# Patient Record
Sex: Female | Born: 1995 | Race: White | Hispanic: No | Marital: Married
Health system: Midwestern US, Community
[De-identification: ages and names within clinical notes are randomized; demographics above are authoritative.]

## PROBLEM LIST (undated history)

## (undated) DIAGNOSIS — R079 Chest pain, unspecified: Secondary | ICD-10-CM

## (undated) DIAGNOSIS — Z30011 Encounter for initial prescription of contraceptive pills: Secondary | ICD-10-CM

## (undated) DIAGNOSIS — R87610 Atypical squamous cells of undetermined significance on cytologic smear of cervix (ASC-US): Secondary | ICD-10-CM

## (undated) DIAGNOSIS — F32A Depression, unspecified: Secondary | ICD-10-CM

## (undated) DIAGNOSIS — R87810 Cervical high risk human papillomavirus (HPV) DNA test positive: Secondary | ICD-10-CM

## (undated) DIAGNOSIS — R0602 Shortness of breath: Secondary | ICD-10-CM

## (undated) DIAGNOSIS — M545 Low back pain, unspecified: Secondary | ICD-10-CM

## (undated) DIAGNOSIS — Z3041 Encounter for surveillance of contraceptive pills: Secondary | ICD-10-CM

## (undated) DIAGNOSIS — N870 Mild cervical dysplasia: Secondary | ICD-10-CM

## (undated) DIAGNOSIS — N879 Dysplasia of cervix uteri, unspecified: Secondary | ICD-10-CM

---
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
## 2017-08-08 ENCOUNTER — Ambulatory Visit: Admit: 2017-08-08 | Discharge: 2017-08-08 | Payer: BLUE CROSS/BLUE SHIELD | Attending: Family | Primary: Family Medicine

## 2017-08-08 DIAGNOSIS — N926 Irregular menstruation, unspecified: Secondary | ICD-10-CM

## 2017-08-08 LAB — AMB POC URINALYSIS DIP STICK AUTO W/O MICRO
Bilirubin (UA POC): NEGATIVE
Blood (UA POC): NEGATIVE
Glucose (UA POC): NEGATIVE
Ketones (UA POC): NEGATIVE
Leukocyte esterase (UA POC): NEGATIVE
Nitrites (UA POC): NEGATIVE
Protein (UA POC): NEGATIVE
Specific gravity (UA POC): 1.02 (ref 1.001–1.035)
Urobilinogen (UA POC): NORMAL (ref 0.2–1)
pH (UA POC): 6 (ref 4.6–8.0)

## 2017-08-08 LAB — AMB POC URINE PREGNANCY TEST, VISUAL COLOR COMPARISON: HCG urine, Ql. (POC): NEGATIVE

## 2017-08-08 MED ORDER — MELOXICAM 7.5 MG TAB
7.5 mg | ORAL_TABLET | Freq: Every day | ORAL | 0 refills | Status: DC
Start: 2017-08-08 — End: 2018-02-13

## 2017-08-08 MED ORDER — TRIMETHOPRIM-SULFAMETHOXAZOLE 160 MG-800 MG TAB
160-800 mg | ORAL_TABLET | Freq: Two times a day (BID) | ORAL | 0 refills | Status: AC
Start: 2017-08-08 — End: 2017-08-18

## 2017-08-08 MED ORDER — CYCLOBENZAPRINE 5 MG TAB
5 mg | ORAL_TABLET | Freq: Every evening | ORAL | 0 refills | Status: DC | PRN
Start: 2017-08-08 — End: 2018-02-13

## 2017-08-08 NOTE — Progress Notes (Signed)
Please mail normal letter.  aw

## 2017-08-08 NOTE — Progress Notes (Signed)
FMLA Forms faxed 08/08/17  pl
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
## 2017-08-08 NOTE — Progress Notes (Signed)
Lab letter mailed 08/09/17  pl

## 2017-08-09 LAB — CBC WITH AUTOMATED DIFF
ABS. BASOPHILS: 0 10*3/uL (ref 0.0–0.2)
ABS. EOSINOPHILS: 0.3 10*3/uL (ref 0.0–0.4)
ABS. IMM. GRANS.: 0 10*3/uL (ref 0.0–0.1)
ABS. MONOCYTES: 0.4 10*3/uL (ref 0.1–0.9)
ABS. NEUTROPHILS: 2.7 10*3/uL (ref 1.4–7.0)
Abs Lymphocytes: 2.3 10*3/uL (ref 0.7–3.1)
BASOPHILS: 0 %
EOSINOPHILS: 6 %
HCT: 38.5 % (ref 34.0–46.6)
HGB: 12.1 g/dL (ref 11.1–15.9)
IMMATURE GRANULOCYTES: 0 %
Lymphocytes: 40 %
MCH: 28.5 pg (ref 26.6–33.0)
MCHC: 31.4 g/dL — ABNORMAL LOW (ref 31.5–35.7)
MCV: 91 fL (ref 79–97)
MONOCYTES: 7 %
NEUTROPHILS: 47 %
PLATELET: 316 10*3/uL (ref 150–379)
RBC: 4.25 x10E6/uL (ref 3.77–5.28)
RDW: 13.4 % (ref 12.3–15.4)
WBC: 5.8 10*3/uL (ref 3.4–10.8)

## 2017-08-09 LAB — 11-DRUG SCREEN, URINE W RFLX CONFIRM
Amphetamines, urine: NEGATIVE ng/mL
Barbiturates: NEGATIVE ng/mL
Benzodiazepines: NEGATIVE ng/mL
Cannabinoids: NEGATIVE ng/mL
Cocaine: NEGATIVE ng/mL
MDMA: NEGATIVE ng/mL
Methadone Screen, Urine: NEGATIVE ng/mL
Methaqualone: NEGATIVE ng/mL
Opiates: NEGATIVE ng/mL
PROPOXYPHENE, URINE: NEGATIVE ng/mL
Phencyclidine: NEGATIVE ng/mL

## 2017-08-29 ENCOUNTER — Encounter

## 2017-09-11 NOTE — Telephone Encounter (Signed)
Last OV 08/08/17, calling today advising she has several abscesses.   Asking for RF Bactrim or does she need OV?

## 2017-10-15 ENCOUNTER — Encounter

## 2017-10-19 ENCOUNTER — Ambulatory Visit: Admit: 2017-10-19 | Discharge: 2017-10-19 | Payer: BLUE CROSS/BLUE SHIELD | Attending: Family | Primary: Family Medicine

## 2017-10-19 ENCOUNTER — Ambulatory Visit: Attending: Family | Primary: Family Medicine

## 2017-10-19 DIAGNOSIS — Z30011 Encounter for initial prescription of contraceptive pills: Secondary | ICD-10-CM

## 2017-10-19 MED ORDER — NORETHINDRONE-ETHINYL ESTRADIOL-IRON 1 MG-20 MCG TAB
1 mg-20 mcg (2)/75 mg (7) | PACK | Freq: Every day | ORAL | 3 refills | Status: DC
Start: 2017-10-19 — End: 2019-03-15

## 2017-10-19 NOTE — Progress Notes (Signed)
LASHAE Perry is a 22 y.o. female who presents with   Chief Complaint   Patient presents with   ??? Contraception      BC refill   ??? Warts      left fourth toe       History of Present Illness  She is feeling well.  No side effects with current  OCP.  No history of problems with hormones.  No personal or family history of blood clots.  Patient does not smoke but does use the vaporizer pen.  Encouraged cessation.  No history of migraines with aura.  She is sexually active.     Skin Problem   The history is provided by the patient. This is a new problem. The current episode started more than 1 week ago. The problem occurs constantly. The problem has not changed since onset.Pertinent negatives include no chest pain, no abdominal pain, no headaches and no shortness of breath. Nothing aggravates the symptoms. Nothing relieves the symptoms. She has tried nothing for the symptoms.       Review of Systems  Review of Systems   Constitutional: Negative for chills, fever and malaise/fatigue.   HENT: Negative for congestion, ear pain, sinus pain and sore throat.    Eyes: Negative for blurred vision, double vision, discharge and redness.   Respiratory: Negative for cough, sputum production, shortness of breath and wheezing.    Cardiovascular: Negative for chest pain, palpitations and leg swelling.   Gastrointestinal: Negative for abdominal pain, blood in stool, constipation, diarrhea, nausea and vomiting.   Genitourinary: Negative for dysuria, frequency, hematuria and urgency.   Musculoskeletal: Negative for joint pain and myalgias.   Neurological: Negative for dizziness, sensory change and headaches.   Endo/Heme/Allergies: Negative for environmental allergies.   Psychiatric/Behavioral: Negative for depression and suicidal ideas. The patient is not nervous/anxious.        Medications  Current Outpatient Medications   Medication Sig   ??? norethindrone-ethinyl estradiol (JUNEL FE 1/20) 1 mg-20 mcg (21)/75 mg (7) tab Take 1 Tab by  mouth daily.   ??? cyclobenzaprine (FLEXERIL) 5 mg tablet Take 1 Tab by mouth nightly as needed for Muscle Spasm(s).   ??? meloxicam (MOBIC) 7.5 mg tablet Take 1 Tab by mouth daily.     No current facility-administered medications for this visit.        Past Medical History  Past Medical History:   Diagnosis Date   ??? Anemia    ??? Asthma    ??? Hydradenitis        Surgical History  Past Surgical History:   Procedure Laterality Date   ??? HX OTHER SURGICAL Right     Cyst Removed from Right Leg   ??? HX WISDOM TEETH EXTRACTION            Family History  Family History   Problem Relation Age of Onset   ??? Diabetes Maternal Aunt    ??? Diabetes Paternal Uncle    ??? Diabetes Maternal Grandmother        Social History  Social History     Socioeconomic History   ??? Marital status: SINGLE     Spouse name: Not on file   ??? Number of children: Not on file   ??? Years of education: Not on file   ??? Highest education level: Not on file   Occupational History   ??? Not on file   Social Needs   ??? Financial resource strain: Not on file   ???  Food insecurity:     Worry: Not on file     Inability: Not on file   ??? Transportation needs:     Medical: Not on file     Non-medical: Not on file   Tobacco Use   ??? Smoking status: Never Smoker   ??? Smokeless tobacco: Never Used   ??? Tobacco comment: vaporizor pen   Substance and Sexual Activity   ??? Alcohol use: Yes     Alcohol/week: 0.0 - 0.5 oz   ??? Drug use: No   ??? Sexual activity: Not on file   Lifestyle   ??? Physical activity:     Days per week: Not on file     Minutes per session: Not on file   ??? Stress: Not on file   Relationships   ??? Social connections:     Talks on phone: Not on file     Gets together: Not on file     Attends religious service: Not on file     Active member of club or organization: Not on file     Attends meetings of clubs or organizations: Not on file     Relationship status: Not on file   ??? Intimate partner violence:     Fear of current or ex partner: Not on file     Emotionally abused: Not on  file     Physically abused: Not on file     Forced sexual activity: Not on file   Other Topics Concern   ??? Not on file   Social History Narrative   ??? Not on file       Social History     Tobacco Use   Smoking Status Never Smoker   Smokeless Tobacco Never Used   Tobacco Comment    vaporizor pen       Allergies  No Known Allergies    Vital Signs  Body mass index is 19.73 kg/m??.  Vitals:    10/19/17 1101   BP: 108/70   Pulse: 70   SpO2: 98%   Weight: 126 lb (57.2 kg)       Physical Exam  Physical Exam   Constitutional: She is well-developed, well-nourished, and in no distress. No distress.   HENT:   Head: Normocephalic and atraumatic.   Right Ear: External ear normal.   Left Ear: External ear normal.   Nose: Nose normal.   Mouth/Throat: Oropharynx is clear and moist.   Eyes: Pupils are equal, round, and reactive to light. Conjunctivae and EOM are normal.   Neck: Normal range of motion. Neck supple. No thyromegaly present.   Cardiovascular: Normal rate and regular rhythm.   No murmur heard.  Pulmonary/Chest: Effort normal and breath sounds normal. No respiratory distress. She has no decreased breath sounds. She has no wheezes. She has no rhonchi. She has no rales. She exhibits no tenderness.   Musculoskeletal: Normal range of motion.   Lymphadenopathy:     She has no cervical adenopathy.   Neurological: She is alert.   Skin: Skin is warm and dry. Lesion (corn to left fourth toe lateral aspect) noted. No rash noted. She is not diaphoretic.   Psychiatric: Mood and affect normal.   Nursing note and vitals reviewed.        Assessment and Plan  Diagnoses and all orders for this visit:    1. Family planning, BCP (birth control pills) initial prescription  -     norethindrone-ethinyl estradiol (JUNEL FE 1/20) 1 mg-20  mcg (21)/75 mg (7) tab; Take 1 Tab by mouth daily.    2. Corn of toe      Instructed in precautions regarding use of BCPs, including STD precautions, blood clot risk, and need for lowest effective dose.  Pads  encouraged for corn.    Follow-up disposition: 1 year routine follow-up and PRN.      Percival Spanish FNP-bC

## 2017-10-19 NOTE — Progress Notes (Signed)
Amanda Perry is a 22 y.o. female who presents with   Chief Complaint   Patient presents with   ??? Contraception      BC refill   ??? Warts      left fourth toe       History of Present Illness  She is feeling well.  No side effects with current  OCP.  No history of problems with hormones.  No personal or family history of blood clots.  Patient does not smoke but does use the vaporizer pen.  Encouraged cessation.  No history of migraines with aura.  She is sexually active.     Skin Problem   The history is provided by the patient. This is a new problem. The current episode started more than 1 week ago. The problem occurs constantly. The problem has not changed since onset.Pertinent negatives include no chest pain, no abdominal pain, no headaches and no shortness of breath. Nothing aggravates the symptoms. Nothing relieves the symptoms. She has tried nothing for the symptoms.       Review of Systems  Review of Systems   Constitutional: Negative for chills, fever and malaise/fatigue.   HENT: Negative for congestion, ear pain, sinus pain and sore throat.    Eyes: Negative for blurred vision, double vision, discharge and redness.   Respiratory: Negative for cough, sputum production, shortness of breath and wheezing.    Cardiovascular: Negative for chest pain, palpitations and leg swelling.   Gastrointestinal: Negative for abdominal pain, blood in stool, constipation, diarrhea, nausea and vomiting.   Genitourinary: Negative for dysuria, frequency, hematuria and urgency.   Musculoskeletal: Negative for joint pain and myalgias.   Neurological: Negative for dizziness, sensory change and headaches.   Endo/Heme/Allergies: Negative for environmental allergies.   Psychiatric/Behavioral: Negative for depression and suicidal ideas. The patient is not nervous/anxious.        Medications  Current Outpatient Medications   Medication Sig   ??? norethindrone-ethinyl estradiol (JUNEL FE 1/20) 1 mg-20 mcg (21)/75 mg  (7) tab Take 1 Tab by mouth daily.   ??? cyclobenzaprine (FLEXERIL) 5 mg tablet Take 1 Tab by mouth nightly as needed for Muscle Spasm(s).   ??? meloxicam (MOBIC) 7.5 mg tablet Take 1 Tab by mouth daily.     No current facility-administered medications for this visit.        Past Medical History  Past Medical History:   Diagnosis Date   ??? Anemia    ??? Asthma    ??? Hydradenitis        Surgical History  Past Surgical History:   Procedure Laterality Date   ??? HX OTHER SURGICAL Right     Cyst Removed from Right Leg   ??? HX WISDOM TEETH EXTRACTION            Family History  Family History   Problem Relation Age of Onset   ??? Diabetes Maternal Aunt    ??? Diabetes Paternal Uncle    ??? Diabetes Maternal Grandmother        Social History  Social History     Socioeconomic History   ??? Marital status: SINGLE     Spouse name: Not on file   ??? Number of children: Not on file   ??? Years of education: Not on file   ??? Highest education level: Not on file   Occupational History   ??? Not on file   Social Needs   ??? Financial resource strain: Not on file   ???  Food insecurity:     Worry: Not on file     Inability: Not on file   ??? Transportation needs:     Medical: Not on file     Non-medical: Not on file   Tobacco Use   ??? Smoking status: Never Smoker   ??? Smokeless tobacco: Never Used   ??? Tobacco comment: vaporizor pen   Substance and Sexual Activity   ??? Alcohol use: Yes     Alcohol/week: 0.0 - 0.5 oz   ??? Drug use: No   ??? Sexual activity: Not on file   Lifestyle   ??? Physical activity:     Days per week: Not on file     Minutes per session: Not on file   ??? Stress: Not on file   Relationships   ??? Social connections:     Talks on phone: Not on file     Gets together: Not on file     Attends religious service: Not on file     Active member of club or organization: Not on file     Attends meetings of clubs or organizations: Not on file     Relationship status: Not on file   ??? Intimate partner violence:     Fear of current or ex partner: Not on file      Emotionally abused: Not on file     Physically abused: Not on file     Forced sexual activity: Not on file   Other Topics Concern   ??? Not on file   Social History Narrative   ??? Not on file       Social History     Tobacco Use   Smoking Status Never Smoker   Smokeless Tobacco Never Used   Tobacco Comment    vaporizor pen       Allergies  No Known Allergies    Vital Signs  Body mass index is 19.73 kg/m??.  Vitals:    10/19/17 1101   BP: 108/70   Pulse: 70   SpO2: 98%   Weight: 126 lb (57.2 kg)       Physical Exam  Physical Exam   Constitutional: She is well-developed, well-nourished, and in no distress. No distress.   HENT:   Head: Normocephalic and atraumatic.   Right Ear: External ear normal.   Left Ear: External ear normal.   Nose: Nose normal.   Mouth/Throat: Oropharynx is clear and moist.   Eyes: Pupils are equal, round, and reactive to light. Conjunctivae and EOM are normal.   Neck: Normal range of motion. Neck supple. No thyromegaly present.   Cardiovascular: Normal rate and regular rhythm.   No murmur heard.  Pulmonary/Chest: Effort normal and breath sounds normal. No respiratory distress. She has no decreased breath sounds. She has no wheezes. She has no rhonchi. She has no rales. She exhibits no tenderness.   Musculoskeletal: Normal range of motion.   Lymphadenopathy:     She has no cervical adenopathy.   Neurological: She is alert.   Skin: Skin is warm and dry. Lesion (corn to left fourth toe lateral aspect) noted. No rash noted. She is not diaphoretic.   Psychiatric: Mood and affect normal.   Nursing note and vitals reviewed.        Assessment and Plan  Diagnoses and all orders for this visit:    1. Family planning, BCP (birth control pills) initial prescription  -     norethindrone-ethinyl estradiol (JUNEL FE 1/20) 1 mg-20  mcg (21)/75 mg (7) tab; Take 1 Tab by mouth daily.    2. Corn of toe      Instructed in precautions regarding use of BCPs, including STD  precautions, blood clot risk, and need for lowest effective dose.  Pads encouraged for corn.    Follow-up disposition: 1 year routine follow-up and PRN.      Percival Spanish FNP-bC

## 2018-02-13 ENCOUNTER — Ambulatory Visit: Admit: 2018-02-13 | Discharge: 2018-02-13 | Payer: BLUE CROSS/BLUE SHIELD | Attending: Family | Primary: Family Medicine

## 2018-02-13 ENCOUNTER — Ambulatory Visit: Attending: Family | Primary: Family Medicine

## 2018-02-13 DIAGNOSIS — L0291 Cutaneous abscess, unspecified: Secondary | ICD-10-CM

## 2018-02-13 NOTE — Progress Notes (Signed)
[facility]  02/13/18  pl

## 2018-02-13 NOTE — Progress Notes (Signed)
Amanda Perry is a 22 y.o. female who presents with   Chief Complaint   Patient presents with   ??? Other      Pt has switched employers and needs new FMLA paperwork for recurrent skin infections       History of Present Illness  Skin Problem   The history is provided by the patient. This is a recurrent problem. The current episode started more than 1 week ago. The problem occurs every several days. The problem has not changed since onset.Pertinent negatives include no chest pain, no abdominal pain, no headaches and no shortness of breath. Exacerbated by: unknown. The symptoms are relieved by medications. Treatments tried: pt has seen two dermatologists, taken multiple courses of abx, and now uses tea tree oil, was previously told this was hidradenitis suppurativa but now she is not sure.       Review of Systems  Review of Systems   Constitutional: Negative for chills and fever.   HENT: Negative for congestion, ear pain, sinus pain and sore throat.    Eyes: Negative for discharge and redness.   Respiratory: Negative for cough, shortness of breath and wheezing.    Cardiovascular: Negative for chest pain, palpitations and leg swelling.   Gastrointestinal: Negative for abdominal pain, constipation, diarrhea, nausea and vomiting.   Genitourinary: Negative for dysuria, frequency and urgency.   Musculoskeletal: Positive for back pain (chronic). Negative for falls.   Skin: Negative for itching and rash.   Neurological: Negative for dizziness, weakness and headaches.   Endo/Heme/Allergies: Negative for environmental allergies.   Psychiatric/Behavioral: Negative for depression. The patient is not nervous/anxious and does not have insomnia.        Medications  Current Outpatient Medications   Medication Sig   ??? norethindrone-ethinyl estradiol (JUNEL FE 1/20) 1 mg-20 mcg (21)/75 mg (7) tab Take 1 Tab by mouth daily.     No current facility-administered medications for this visit.        Past Medical History  Past Medical  History:   Diagnosis Date   ??? Anemia    ??? Asthma    ??? Hydradenitis        Surgical History  Past Surgical History:   Procedure Laterality Date   ??? HX OTHER SURGICAL Right     Cyst Removed from Right Leg   ??? HX WISDOM TEETH EXTRACTION            Family History  Family History   Problem Relation Age of Onset   ??? Diabetes Maternal Aunt    ??? Diabetes Paternal Uncle    ??? Diabetes Maternal Grandmother        Social History  Social History     Socioeconomic History   ??? Marital status: SINGLE     Spouse name: Not on file   ??? Number of children: Not on file   ??? Years of education: Not on file   ??? Highest education level: Not on file   Occupational History   ??? Not on file   Social Needs   ??? Financial resource strain: Not on file   ??? Food insecurity:     Worry: Not on file     Inability: Not on file   ??? Transportation needs:     Medical: Not on file     Non-medical: Not on file   Tobacco Use   ??? Smoking status: Never Smoker   ??? Smokeless tobacco: Never Used   ??? Tobacco comment: vaporizor pen  Substance and Sexual Activity   ??? Alcohol use: Yes     Alcohol/week: 0.0 - 0.8 standard drinks   ??? Drug use: No   ??? Sexual activity: Not on file   Lifestyle   ??? Physical activity:     Days per week: Not on file     Minutes per session: Not on file   ??? Stress: Not on file   Relationships   ??? Social connections:     Talks on phone: Not on file     Gets together: Not on file     Attends religious service: Not on file     Active member of club or organization: Not on file     Attends meetings of clubs or organizations: Not on file     Relationship status: Not on file   ??? Intimate partner violence:     Fear of current or ex partner: Not on file     Emotionally abused: Not on file     Physically abused: Not on file     Forced sexual activity: Not on file   Other Topics Concern   ??? Not on file   Social History Narrative   ??? Not on file       Social History     Tobacco Use   Smoking Status Never Smoker   Smokeless Tobacco Never Used   Tobacco  Comment    vaporizor pen       Allergies  No Known Allergies    Vital Signs  Body mass index is 18.79 kg/m??.  Vitals:    02/13/18 1323   BP: 108/82   Pulse: 65   SpO2: 99%   Weight: 120 lb (54.4 kg)       Physical Exam  Physical Exam   Constitutional: She is well-developed, well-nourished, and in no distress. No distress.   HENT:   Head: Normocephalic and atraumatic.   Right Ear: External ear normal.   Left Ear: External ear normal.   Nose: Nose normal.   Mouth/Throat: Uvula is midline, oropharynx is clear and moist and mucous membranes are normal.   Eyes: Pupils are equal, round, and reactive to light. Conjunctivae and EOM are normal.   Neck: Normal range of motion. Neck supple. No thyromegaly present.   Cardiovascular: Normal rate, regular rhythm and normal heart sounds.   Pulmonary/Chest: Effort normal and breath sounds normal. No respiratory distress. She has no wheezes. She has no rales. She exhibits no tenderness.   Musculoskeletal: Normal range of motion.   Lymphadenopathy:     She has no cervical adenopathy.   Neurological: She is alert.   Skin: Skin is warm and dry. She is not diaphoretic.   Scarring to thighs bilaterally   Psychiatric: Mood and affect normal.   Nursing note and vitals reviewed.      Assessment and Plan  Diagnoses and all orders for this visit:    1. Abscess    2. Hydradenitis      [facility] and she declines for now.  FMLA paperwork completed per request.  Follow-up disposition: If symptoms worsen, instructed her to call.          Percival SpanishAbby Wittenberg FNP-BC

## 2018-02-13 NOTE — Progress Notes (Signed)
FMLA forms faxed to [employer]  02/13/18  pl

## 2018-02-13 NOTE — Progress Notes (Signed)
Amanda SledgeKelly Y Perry is a 22 y.o. female who presents with   Chief Complaint   Patient presents with   ??? Other      Pt has switched employers and needs new FMLA paperwork for recurrent skin infections       History of Present Illness  Skin Problem   The history is provided by the patient. This is a recurrent problem. The current episode started more than 1 week ago. The problem occurs every several days. The problem has not changed since onset.Pertinent negatives include no chest pain, no abdominal pain, no headaches and no shortness of breath. Exacerbated by: unknown. The symptoms are relieved by medications. Treatments tried: pt has seen two dermatologists, taken multiple courses of abx, and now uses tea tree oil, was previously told this was hidradenitis suppurativa but now she is not sure.       Review of Systems  Review of Systems   Constitutional: Negative for chills and fever.   HENT: Negative for congestion, ear pain, sinus pain and sore throat.    Eyes: Negative for discharge and redness.   Respiratory: Negative for cough, shortness of breath and wheezing.    Cardiovascular: Negative for chest pain, palpitations and leg swelling.   Gastrointestinal: Negative for abdominal pain, constipation, diarrhea, nausea and vomiting.   Genitourinary: Negative for dysuria, frequency and urgency.   Musculoskeletal: Positive for back pain (chronic). Negative for falls.   Skin: Negative for itching and rash.   Neurological: Negative for dizziness, weakness and headaches.   Endo/Heme/Allergies: Negative for environmental allergies.   Psychiatric/Behavioral: Negative for depression. The patient is not nervous/anxious and does not have insomnia.        Medications  Current Outpatient Medications   Medication Sig   ??? norethindrone-ethinyl estradiol (JUNEL FE 1/20) 1 mg-20 mcg (21)/75 mg (7) tab Take 1 Tab by mouth daily.     No current facility-administered medications for this visit.        Past Medical History   Past Medical History:   Diagnosis Date   ??? Anemia    ??? Asthma    ??? Hydradenitis        Surgical History  Past Surgical History:   Procedure Laterality Date   ??? HX OTHER SURGICAL Right     Cyst Removed from Right Leg   ??? HX WISDOM TEETH EXTRACTION            Family History  Family History   Problem Relation Age of Onset   ??? Diabetes Maternal Aunt    ??? Diabetes Paternal Uncle    ??? Diabetes Maternal Grandmother        Social History  Social History     Socioeconomic History   ??? Marital status: SINGLE     Spouse name: Not on file   ??? Number of children: Not on file   ??? Years of education: Not on file   ??? Highest education level: Not on file   Occupational History   ??? Not on file   Social Needs   ??? Financial resource strain: Not on file   ??? Food insecurity:     Worry: Not on file     Inability: Not on file   ??? Transportation needs:     Medical: Not on file     Non-medical: Not on file   Tobacco Use   ??? Smoking status: Never Smoker   ??? Smokeless tobacco: Never Used   ??? Tobacco comment: vaporizor pen  Substance and Sexual Activity   ??? Alcohol use: Yes     Alcohol/week: 0.0 - 0.8 standard drinks   ??? Drug use: No   ??? Sexual activity: Not on file   Lifestyle   ??? Physical activity:     Days per week: Not on file     Minutes per session: Not on file   ??? Stress: Not on file   Relationships   ??? Social connections:     Talks on phone: Not on file     Gets together: Not on file     Attends religious service: Not on file     Active member of club or organization: Not on file     Attends meetings of clubs or organizations: Not on file     Relationship status: Not on file   ??? Intimate partner violence:     Fear of current or ex partner: Not on file     Emotionally abused: Not on file     Physically abused: Not on file     Forced sexual activity: Not on file   Other Topics Concern   ??? Not on file   Social History Narrative   ??? Not on file       Social History     Tobacco Use   Smoking Status Never Smoker    Smokeless Tobacco Never Used   Tobacco Comment    vaporizor pen       Allergies  No Known Allergies    Vital Signs  Body mass index is 18.79 kg/m??.  Vitals:    02/13/18 1323   BP: 108/82   Pulse: 65   SpO2: 99%   Weight: 120 lb (54.4 kg)       Physical Exam  Physical Exam   Constitutional: She is well-developed, well-nourished, and in no distress. No distress.   HENT:   Head: Normocephalic and atraumatic.   Right Ear: External ear normal.   Left Ear: External ear normal.   Nose: Nose normal.   Mouth/Throat: Uvula is midline, oropharynx is clear and moist and mucous membranes are normal.   Eyes: Pupils are equal, round, and reactive to light. Conjunctivae and EOM are normal.   Neck: Normal range of motion. Neck supple. No thyromegaly present.   Cardiovascular: Normal rate, regular rhythm and normal heart sounds.   Pulmonary/Chest: Effort normal and breath sounds normal. No respiratory distress. She has no wheezes. She has no rales. She exhibits no tenderness.   Musculoskeletal: Normal range of motion.   Lymphadenopathy:     She has no cervical adenopathy.   Neurological: She is alert.   Skin: Skin is warm and dry. She is not diaphoretic.   Scarring to thighs bilaterally   Psychiatric: Mood and affect normal.   Nursing note and vitals reviewed.      Assessment and Plan  Diagnoses and all orders for this visit:    1. Abscess    2. Hydradenitis      Offered referral to [facility] and she declines for now.  FMLA paperwork completed per request.  Follow-up disposition: If symptoms worsen, instructed her to call.          Percival Spanish FNP-BC

## 2018-02-26 ENCOUNTER — Ambulatory Visit: Admit: 2018-02-26 | Discharge: 2018-02-26 | Payer: BLUE CROSS/BLUE SHIELD | Attending: Family | Primary: Family Medicine

## 2018-02-26 ENCOUNTER — Ambulatory Visit: Attending: Family | Primary: Family Medicine

## 2018-02-26 DIAGNOSIS — L0291 Cutaneous abscess, unspecified: Secondary | ICD-10-CM

## 2018-02-26 MED ORDER — TRIMETHOPRIM-SULFAMETHOXAZOLE 160 MG-800 MG TAB
160-800 mg | ORAL_TABLET | Freq: Two times a day (BID) | ORAL | 0 refills | Status: AC
Start: 2018-02-26 — End: 2018-03-08

## 2018-02-26 NOTE — Progress Notes (Signed)
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  I want her to keep her schedule appointment 10/14.  aw

## 2018-02-26 NOTE — Progress Notes (Signed)
Spoke with patient and let her know the result of her culture and that she is on the correct antibiotic.  She voiced understanding about keeping her appointment on 10/14.

## 2018-02-26 NOTE — Progress Notes (Signed)
Amanda Perry is a 22 y.o. female who presents with   Chief Complaint   Patient presents with   ??? Skin Problem      5 inflamed sores on right leg--may want to culture       History of Present Illness  Skin Problem   The history is provided by the patient. This is a recurrent problem. The current episode started more than 2 days ago. The problem occurs constantly. The problem has been gradually worsening. Pertinent negatives include no chest pain, no abdominal pain, no headaches and no shortness of breath. Exacerbated by: unknown. The symptoms are relieved by medications. Treatments tried: pt has seen derm and had several rounds of abx for this in the past.       Review of Systems  Review of Systems   Constitutional: Negative for chills, fever and malaise/fatigue.   HENT: Negative for congestion, ear pain, sinus pain and sore throat.    Eyes: Negative for discharge and redness.   Respiratory: Negative for cough, shortness of breath and wheezing.    Cardiovascular: Negative for chest pain, palpitations and leg swelling.   Gastrointestinal: Negative for abdominal pain, blood in stool, constipation, diarrhea, nausea and vomiting.   Genitourinary: Negative for dysuria, frequency, hematuria and urgency.   Musculoskeletal: Negative for joint pain and myalgias.   Neurological: Negative for dizziness, sensory change and headaches.   Endo/Heme/Allergies: Negative for environmental allergies.   Psychiatric/Behavioral: Negative for depression and suicidal ideas. The patient is not nervous/anxious.        Medications  Current Outpatient Medications   Medication Sig   ??? trimethoprim-sulfamethoxazole (BACTRIM DS, SEPTRA DS) 160-800 mg per tablet Take 1 Tab by mouth two (2) times a day for 10 days.   ??? norethindrone-ethinyl estradiol (JUNEL FE 1/20) 1 mg-20 mcg (21)/75 mg (7) tab Take 1 Tab by mouth daily.     No current facility-administered medications for this visit.        Past Medical History  Past Medical History:    Diagnosis Date   ??? Anemia    ??? Asthma    ??? Hydradenitis        Surgical History  Past Surgical History:   Procedure Laterality Date   ??? HX OTHER SURGICAL Right     Cyst Removed from Right Leg   ??? HX WISDOM TEETH EXTRACTION            Family History  Family History   Problem Relation Age of Onset   ??? Diabetes Maternal Aunt    ??? Diabetes Paternal Uncle    ??? Diabetes Maternal Grandmother        Social History  Social History     Socioeconomic History   ??? Marital status: SINGLE     Spouse name: Not on file   ??? Number of children: Not on file   ??? Years of education: Not on file   ??? Highest education level: Not on file   Occupational History   ??? Not on file   Social Needs   ??? Financial resource strain: Not on file   ??? Food insecurity:     Worry: Not on file     Inability: Not on file   ??? Transportation needs:     Medical: Not on file     Non-medical: Not on file   Tobacco Use   ??? Smoking status: Never Smoker   ??? Smokeless tobacco: Never Used   ??? Tobacco comment: vaporizor pen  Substance and Sexual Activity   ??? Alcohol use: Yes     Alcohol/week: 0.0 - 0.8 standard drinks   ??? Drug use: No   ??? Sexual activity: Not on file   Lifestyle   ??? Physical activity:     Days per week: Not on file     Minutes per session: Not on file   ??? Stress: Not on file   Relationships   ??? Social connections:     Talks on phone: Not on file     Gets together: Not on file     Attends religious service: Not on file     Active member of club or organization: Not on file     Attends meetings of clubs or organizations: Not on file     Relationship status: Not on file   ??? Intimate partner violence:     Fear of current or ex partner: Not on file     Emotionally abused: Not on file     Physically abused: Not on file     Forced sexual activity: Not on file   Other Topics Concern   ??? Not on file   Social History Narrative   ??? Not on file       Social History     Tobacco Use   Smoking Status Never Smoker   Smokeless Tobacco Never Used   Tobacco Comment     vaporizor pen       Allergies  No Known Allergies    Vital Signs  Body mass index is 18.14 kg/m??.  Vitals:    02/26/18 1319   BP: 98/66   Pulse: 71   Temp: 97.5 ??F (36.4 ??C)   SpO2: 98%   Weight: 115 lb 12.8 oz (52.5 kg)       Physical Exam  Physical Exam   Constitutional: She is well-developed, well-nourished, and in no distress. No distress.   HENT:   Head: Normocephalic and atraumatic.   Right Ear: External ear normal.   Left Ear: External ear normal.   Nose: Nose normal.   Mouth/Throat: Uvula is midline, oropharynx is clear and moist and mucous membranes are normal.   Eyes: Pupils are equal, round, and reactive to light. Conjunctivae and EOM are normal.   Neck: Normal range of motion. Neck supple.   Cardiovascular: Normal rate, regular rhythm and normal heart sounds.   Pulmonary/Chest: Effort normal and breath sounds normal. She has no decreased breath sounds. She has no wheezes. She has no rhonchi. She has no rales. She exhibits no tenderness.   Musculoskeletal: Normal range of motion.   Lymphadenopathy:     She has no cervical adenopathy.   Neurological: She is alert.   Skin: Skin is warm and dry. Lesion noted. She is not diaphoretic.   5 inflamed superficial abscesses to anterior thighs bilaterally.  Range in size from approximately a quarter to half dollar.  Some surrounding erythema.  Culture obtained using alcohol, betadine and an 18 gauge needle to the one at the right lateral knee.  hemostasis maintained and site redressed.  She tolerated well.   Psychiatric: Mood and affect normal.   Nursing note and vitals reviewed.      Assessment and Plan  Diagnoses and all orders for this visit:    1. Abscess  -     trimethoprim-sulfamethoxazole (BACTRIM DS, SEPTRA DS) 160-800 mg per tablet; Take 1 Tab by mouth two (2) times a day for 10 days.  -  AEROBIC BACTERIAL CULTURE      Await culture.  Start antibiotic as directed.  Discussed medications, side effects and risks versus benefits in detail.  Concerned about  recent weight loss and the recurrence of these sores.  Recheck in 2 weeks.    Follow-up disposition: If symptoms worsen or fail to improve, instructed her to call.        Percival Spanish FNP-BC

## 2018-02-26 NOTE — Progress Notes (Signed)
Spoke with patient and let her know the result of her culture and that she is on the correct antibiotic.  She voiced understanding about keeping her appointment on 10/14.

## 2018-02-26 NOTE — Progress Notes (Signed)
Amanda Perry is a 22 y.o. female who presents with   Chief Complaint   Patient presents with   ??? Skin Problem      5 inflamed sores on right leg--may want to culture       History of Present Illness  Skin Problem   The history is provided by the patient. This is a recurrent problem. The current episode started more than 2 days ago. The problem occurs constantly. The problem has been gradually worsening. Pertinent negatives include no chest pain, no abdominal pain, no headaches and no shortness of breath. Exacerbated by: unknown. The symptoms are relieved by medications. Treatments tried: pt has seen derm and had several rounds of abx for this in the past.       Review of Systems  Review of Systems   Constitutional: Negative for chills, fever and malaise/fatigue.   HENT: Negative for congestion, ear pain, sinus pain and sore throat.    Eyes: Negative for discharge and redness.   Respiratory: Negative for cough, shortness of breath and wheezing.    Cardiovascular: Negative for chest pain, palpitations and leg swelling.   Gastrointestinal: Negative for abdominal pain, blood in stool, constipation, diarrhea, nausea and vomiting.   Genitourinary: Negative for dysuria, frequency, hematuria and urgency.   Musculoskeletal: Negative for joint pain and myalgias.   Neurological: Negative for dizziness, sensory change and headaches.   Endo/Heme/Allergies: Negative for environmental allergies.   Psychiatric/Behavioral: Negative for depression and suicidal ideas. The patient is not nervous/anxious.        Medications  Current Outpatient Medications   Medication Sig   ??? trimethoprim-sulfamethoxazole (BACTRIM DS, SEPTRA DS) 160-800 mg per tablet Take 1 Tab by mouth two (2) times a day for 10 days.   ??? norethindrone-ethinyl estradiol (JUNEL FE 1/20) 1 mg-20 mcg (21)/75 mg (7) tab Take 1 Tab by mouth daily.     No current facility-administered medications for this visit.        Past Medical History  Past Medical History:    Diagnosis Date   ??? Anemia    ??? Asthma    ??? Hydradenitis        Surgical History  Past Surgical History:   Procedure Laterality Date   ??? HX OTHER SURGICAL Right     Cyst Removed from Right Leg   ??? HX WISDOM TEETH EXTRACTION            Family History  Family History   Problem Relation Age of Onset   ??? Diabetes Maternal Aunt    ??? Diabetes Paternal Uncle    ??? Diabetes Maternal Grandmother        Social History  Social History     Socioeconomic History   ??? Marital status: SINGLE     Spouse name: Not on file   ??? Number of children: Not on file   ??? Years of education: Not on file   ??? Highest education level: Not on file   Occupational History   ??? Not on file   Social Needs   ??? Financial resource strain: Not on file   ??? Food insecurity:     Worry: Not on file     Inability: Not on file   ??? Transportation needs:     Medical: Not on file     Non-medical: Not on file   Tobacco Use   ??? Smoking status: Never Smoker   ??? Smokeless tobacco: Never Used   ??? Tobacco comment: vaporizor pen  Substance and Sexual Activity   ??? Alcohol use: Yes     Alcohol/week: 0.0 - 0.8 standard drinks   ??? Drug use: No   ??? Sexual activity: Not on file   Lifestyle   ??? Physical activity:     Days per week: Not on file     Minutes per session: Not on file   ??? Stress: Not on file   Relationships   ??? Social connections:     Talks on phone: Not on file     Gets together: Not on file     Attends religious service: Not on file     Active member of club or organization: Not on file     Attends meetings of clubs or organizations: Not on file     Relationship status: Not on file   ??? Intimate partner violence:     Fear of current or ex partner: Not on file     Emotionally abused: Not on file     Physically abused: Not on file     Forced sexual activity: Not on file   Other Topics Concern   ??? Not on file   Social History Narrative   ??? Not on file       Social History     Tobacco Use   Smoking Status Never Smoker   Smokeless Tobacco Never Used   Tobacco Comment     vaporizor pen       Allergies  No Known Allergies    Vital Signs  Body mass index is 18.14 kg/m??.  Vitals:    02/26/18 1319   BP: 98/66   Pulse: 71   Temp: 97.5 ??F (36.4 ??C)   SpO2: 98%   Weight: 115 lb 12.8 oz (52.5 kg)       Physical Exam  Physical Exam   Constitutional: She is well-developed, well-nourished, and in no distress. No distress.   HENT:   Head: Normocephalic and atraumatic.   Right Ear: External ear normal.   Left Ear: External ear normal.   Nose: Nose normal.   Mouth/Throat: Uvula is midline, oropharynx is clear and moist and mucous membranes are normal.   Eyes: Pupils are equal, round, and reactive to light. Conjunctivae and EOM are normal.   Neck: Normal range of motion. Neck supple.   Cardiovascular: Normal rate, regular rhythm and normal heart sounds.   Pulmonary/Chest: Effort normal and breath sounds normal. She has no decreased breath sounds. She has no wheezes. She has no rhonchi. She has no rales. She exhibits no tenderness.   Musculoskeletal: Normal range of motion.   Lymphadenopathy:     She has no cervical adenopathy.   Neurological: She is alert.   Skin: Skin is warm and dry. Lesion noted. She is not diaphoretic.   5 inflamed superficial abscesses to anterior thighs bilaterally.  Range in size from approximately a quarter to half dollar.  Some surrounding erythema.  Culture obtained using alcohol, betadine and an 18 gauge needle to the one at the right lateral knee.  hemostasis maintained and site redressed.  She tolerated well.   Psychiatric: Mood and affect normal.   Nursing note and vitals reviewed.      Assessment and Plan  Diagnoses and all orders for this visit:    1. Abscess  -     trimethoprim-sulfamethoxazole (BACTRIM DS, SEPTRA DS) 160-800 mg per tablet; Take 1 Tab by mouth two (2) times a day for 10 days.  -  AEROBIC BACTERIAL CULTURE      Await culture.  Start antibiotic as directed.  Discussed medications, side effects and risks versus benefits in detail.   Concerned about recent weight loss and the recurrence of these sores.  Recheck in 2 weeks.    Follow-up disposition: If symptoms worsen or fail to improve, instructed her to call.        Percival Spanish FNP-BC

## 2018-03-01 LAB — CULTURE, MISC. AEROBIC

## 2018-03-01 LAB — CULTURE, AEROBIC

## 2018-03-12 ENCOUNTER — Ambulatory Visit: Admit: 2018-03-12 | Discharge: 2018-03-12 | Payer: BLUE CROSS/BLUE SHIELD | Attending: Family | Primary: Family Medicine

## 2018-03-12 ENCOUNTER — Ambulatory Visit: Attending: Family | Primary: Family Medicine

## 2018-03-12 DIAGNOSIS — Z8614 Personal history of Methicillin resistant Staphylococcus aureus infection: Secondary | ICD-10-CM

## 2018-03-12 MED ORDER — MUPIROCIN 2 % OINTMENT
2 % | Freq: Every day | CUTANEOUS | 0 refills | Status: DC
Start: 2018-03-12 — End: 2019-09-26

## 2018-03-12 NOTE — Progress Notes (Signed)
Amanda Perry is a 22 y.o. female who presents with   Chief Complaint   Patient presents with   ??? Other      follow up for abscesses on both legs---healing--Finished antibiotic on Sat.       History of Present Illness  Skin Problem   The history is provided by the patient. This is a recurrent problem. The problem has been gradually improving. Pertinent negatives include no chest pain, no abdominal pain, no headaches and no shortness of breath. The symptoms are relieved by medications. Treatments tried: recent culture shows MRSA - treated with Bactrim DS x 10 days. The treatment provided moderate relief.     Concerned that weight was down at last OV.  Back up to baseline - 122 lbs.  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  She will continue to monitor closely at home and let us know if she has anymore big shifts in her weight.        Review of Systems  Review of Systems   Constitutional: Negative for chills, fever and malaise/fatigue.   HENT: Negative for congestion, ear pain, sinus pain and sore throat.    Eyes: Negative for discharge and redness.   Respiratory: Negative for cough, shortness of breath and wheezing.    Cardiovascular: Negative for chest pain, palpitations and leg swelling.   Gastrointestinal: Negative for abdominal pain, blood in stool, constipation, diarrhea, nausea and vomiting.   Genitourinary: Negative for dysuria, frequency, hematuria and urgency.   Musculoskeletal: Negative for joint pain and myalgias.   Skin: Negative for itching.   Neurological: Negative for dizziness, sensory change and headaches.   Endo/Heme/Allergies: Negative for environmental allergies.   Psychiatric/Behavioral: Negative for depression and suicidal ideas. The patient is not nervous/anxious.        Medications  Current Outpatient Medications   Medication Sig   ??? mupirocin (BACTROBAN) 2 % ointment Apply  to affected area daily.   ??? norethindrone-ethinyl estradiol (JUNEL FE 1/20) 1 mg-20 mcg  (21)/75 mg (7) tab Take 1 Tab by mouth daily.     No current facility-administered medications for this visit.        Past Medical History  Past Medical History:   Diagnosis Date   ??? Anemia    ??? Asthma    ??? Hydradenitis        Surgical History  Past Surgical History:   Procedure Laterality Date   ??? HX OTHER SURGICAL Right     Cyst Removed from Right Leg   ??? HX WISDOM TEETH EXTRACTION            Family History  Family History   Problem Relation Age of Onset   ??? Diabetes Maternal Aunt    ??? Diabetes Paternal Uncle    ??? Diabetes Maternal Grandmother        Social History  Social History     Socioeconomic History   ??? Marital status: SINGLE     Spouse name: Not on file   ??? Number of children: Not on file   ??? Years of education: Not on file   ??? Highest education level: Not on file   Occupational History   ??? Not on file   Social Needs   ??? Financial resource strain: Not on file   ??? Food insecurity:     Worry: Not on file     Inability: Not on file   ??? Transportation needs:     Medical: Not  on file     Non-medical: Not on file   Tobacco Use   ??? Smoking status: Never Smoker   ??? Smokeless tobacco: Never Used   ??? Tobacco comment: vaporizor pen   Substance and Sexual Activity   ??? Alcohol use: Yes     Alcohol/week: 0.0 - 0.8 standard drinks   ??? Drug use: No   ??? Sexual activity: Not on file   Lifestyle   ??? Physical activity:     Days per week: Not on file     Minutes per session: Not on file   ??? Stress: Not on file   Relationships   ??? Social connections:     Talks on phone: Not on file     Gets together: Not on file     Attends religious service: Not on file     Active member of club or organization: Not on file     Attends meetings of clubs or organizations: Not on file     Relationship status: Not on file   ??? Intimate partner violence:     Fear of current or ex partner: Not on file     Emotionally abused: Not on file     Physically abused: Not on file     Forced sexual activity: Not on file   Other Topics Concern   ??? Not on file    Social History Narrative   ??? Not on file       Social History     Tobacco Use   Smoking Status Never Smoker   Smokeless Tobacco Never Used   Tobacco Comment    vaporizor pen       Allergies  No Known Allergies    Vital Signs  Body mass index is 19.17 kg/m??.  Vitals:    03/12/18 0826   BP: 122/74   Pulse: 76   SpO2: 99%   Weight: 122 lb 6.4 oz (55.5 kg)       Physical Exam  Physical Exam   Constitutional: She is well-developed, well-nourished, and in no distress. No distress.   HENT:   Head: Normocephalic and atraumatic.   Right Ear: External ear normal.   Left Ear: External ear normal.   Nose: Nose normal.   Mouth/Throat: Uvula is midline, oropharynx is clear and moist and mucous membranes are normal.   Eyes: Pupils are equal, round, and reactive to light. Conjunctivae and EOM are normal.   Neck: Normal range of motion. Neck supple.   Cardiovascular: Normal rate, regular rhythm and normal heart sounds.   Pulmonary/Chest: Effort normal and breath sounds normal. She has no decreased breath sounds. She has no wheezes. She has no rhonchi. She has no rales. She exhibits no tenderness.   Musculoskeletal: Normal range of motion.   Lymphadenopathy:     She has no cervical adenopathy.   Neurological: She is alert.   Skin: Skin is warm and dry. Lesion (several scars from abscesses in various stages of healing to bilateral thighs and right calf, no redness, streaking or drainage noted ) noted. She is not diaphoretic.   Psychiatric: Mood and affect normal.   Nursing note and vitals reviewed.      Assessment and Plan  Diagnoses and all orders for this visit:    1. History of MRSA infection  -     mupirocin (BACTROBAN) 2 % ointment; Apply  to affected area daily.    2. Abscess of thigh      Discussed medications, side  effects and risks versus benefits in detail.  Antibacterial body wash encouraged.    Follow-up disposition: If symptoms worsen or fail to improve, instructed her to call.        Percival Spanish FNP-BC

## 2018-03-12 NOTE — Progress Notes (Signed)
Amanda Perry is a 22 y.o. female who presents with   Chief Complaint   Patient presents with   ??? Other      follow up for abscesses on both legs---healing--Finished antibiotic on Sat.       History of Present Illness  Skin Problem   The history is provided by the patient. This is a recurrent problem. The problem has been gradually improving. Pertinent negatives include no chest pain, no abdominal pain, no headaches and no shortness of breath. The symptoms are relieved by medications. Treatments tried: recent culture shows MRSA - treated with Bactrim DS x 10 days. The treatment provided moderate relief.     Concerned that weight was down at last OV.  Back up to baseline - 122 lbs.  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  She will continue to monitor closely at home and let us know if she has anymore big shifts in her weight.        Review of Systems  Review of Systems   Constitutional: Negative for chills, fever and malaise/fatigue.   HENT: Negative for congestion, ear pain, sinus pain and sore throat.    Eyes: Negative for discharge and redness.   Respiratory: Negative for cough, shortness of breath and wheezing.    Cardiovascular: Negative for chest pain, palpitations and leg swelling.   Gastrointestinal: Negative for abdominal pain, blood in stool, constipation, diarrhea, nausea and vomiting.   Genitourinary: Negative for dysuria, frequency, hematuria and urgency.   Musculoskeletal: Negative for joint pain and myalgias.   Skin: Negative for itching.   Neurological: Negative for dizziness, sensory change and headaches.   Endo/Heme/Allergies: Negative for environmental allergies.   Psychiatric/Behavioral: Negative for depression and suicidal ideas. The patient is not nervous/anxious.        Medications  Current Outpatient Medications   Medication Sig   ??? mupirocin (BACTROBAN) 2 % ointment Apply  to affected area daily.    ??? norethindrone-ethinyl estradiol (JUNEL FE 1/20) 1 mg-20 mcg (21)/75 mg (7) tab Take 1 Tab by mouth daily.     No current facility-administered medications for this visit.        Past Medical History  Past Medical History:   Diagnosis Date   ??? Anemia    ??? Asthma    ??? Hydradenitis        Surgical History  Past Surgical History:   Procedure Laterality Date   ??? HX OTHER SURGICAL Right     Cyst Removed from Right Leg   ??? HX WISDOM TEETH EXTRACTION            Family History  Family History   Problem Relation Age of Onset   ??? Diabetes Maternal Aunt    ??? Diabetes Paternal Uncle    ??? Diabetes Maternal Grandmother        Social History  Social History     Socioeconomic History   ??? Marital status: SINGLE     Spouse name: Not on file   ??? Number of children: Not on file   ??? Years of education: Not on file   ??? Highest education level: Not on file   Occupational History   ??? Not on file   Social Needs   ??? Financial resource strain: Not on file   ??? Food insecurity:     Worry: Not on file     Inability: Not on file   ??? Transportation needs:     Medical: Not  on file     Non-medical: Not on file   Tobacco Use   ??? Smoking status: Never Smoker   ??? Smokeless tobacco: Never Used   ??? Tobacco comment: vaporizor pen   Substance and Sexual Activity   ??? Alcohol use: Yes     Alcohol/week: 0.0 - 0.8 standard drinks   ??? Drug use: No   ??? Sexual activity: Not on file   Lifestyle   ??? Physical activity:     Days per week: Not on file     Minutes per session: Not on file   ??? Stress: Not on file   Relationships   ??? Social connections:     Talks on phone: Not on file     Gets together: Not on file     Attends religious service: Not on file     Active member of club or organization: Not on file     Attends meetings of clubs or organizations: Not on file     Relationship status: Not on file   ??? Intimate partner violence:     Fear of current or ex partner: Not on file     Emotionally abused: Not on file     Physically abused: Not on file      Forced sexual activity: Not on file   Other Topics Concern   ??? Not on file   Social History Narrative   ??? Not on file       Social History     Tobacco Use   Smoking Status Never Smoker   Smokeless Tobacco Never Used   Tobacco Comment    vaporizor pen       Allergies  No Known Allergies    Vital Signs  Body mass index is 19.17 kg/m??.  Vitals:    03/12/18 0826   BP: 122/74   Pulse: 76   SpO2: 99%   Weight: 122 lb 6.4 oz (55.5 kg)       Physical Exam  Physical Exam   Constitutional: She is well-developed, well-nourished, and in no distress. No distress.   HENT:   Head: Normocephalic and atraumatic.   Right Ear: External ear normal.   Left Ear: External ear normal.   Nose: Nose normal.   Mouth/Throat: Uvula is midline, oropharynx is clear and moist and mucous membranes are normal.   Eyes: Pupils are equal, round, and reactive to light. Conjunctivae and EOM are normal.   Neck: Normal range of motion. Neck supple.   Cardiovascular: Normal rate, regular rhythm and normal heart sounds.   Pulmonary/Chest: Effort normal and breath sounds normal. She has no decreased breath sounds. She has no wheezes. She has no rhonchi. She has no rales. She exhibits no tenderness.   Musculoskeletal: Normal range of motion.   Lymphadenopathy:     She has no cervical adenopathy.   Neurological: She is alert.   Skin: Skin is warm and dry. Lesion (several scars from abscesses in various stages of healing to bilateral thighs and right calf, no redness, streaking or drainage noted ) noted. She is not diaphoretic.   Psychiatric: Mood and affect normal.   Nursing note and vitals reviewed.      Assessment and Plan  Diagnoses and all orders for this visit:    1. History of MRSA infection  -     mupirocin (BACTROBAN) 2 % ointment; Apply  to affected area daily.    2. Abscess of thigh      Discussed medications, side  effects and risks versus benefits in detail.  Antibacterial body wash encouraged.     Follow-up disposition: If symptoms worsen or fail to improve, instructed her to call.        Percival Spanish FNP-BC

## 2018-10-14 ENCOUNTER — Encounter

## 2018-10-15 NOTE — Telephone Encounter (Signed)
Called patient , she does not need a refill for [facility] at this time

## 2018-12-04 ENCOUNTER — Ambulatory Visit: Admit: 2018-12-04 | Discharge: 2018-12-04 | Payer: BLUE CROSS/BLUE SHIELD | Attending: Family | Primary: Family Medicine

## 2018-12-04 ENCOUNTER — Ambulatory Visit: Attending: Family | Primary: Family Medicine

## 2018-12-04 DIAGNOSIS — Z3041 Encounter for surveillance of contraceptive pills: Secondary | ICD-10-CM

## 2018-12-04 NOTE — Progress Notes (Signed)
Amanda SledgeKelly Y Perry is a 23 y.o. female who presents with   Chief Complaint   Patient presents with   ??? Gyn Exam     Patient here for pap but on her period   ??? Warts       History of Present Illness  She is feeling well.  No side effects with current  OCP.  No history of problems with hormones.  No personal or family history of blood clots.  Patient does not smoke.  No history of migraines with aura.  She is not currently sexually active.       Skin Problem   The history is provided by the patient. The current episode started more than 1 week ago. The problem occurs constantly. The problem has not changed since onset.Pertinent negatives include no chest pain, no abdominal pain, no headaches and no shortness of breath. Nothing aggravates the symptoms. Nothing relieves the symptoms. Treatments tried: multiple otc. The treatment provided no relief.       Review of Systems  Review of Systems   Respiratory: Negative for shortness of breath.    Cardiovascular: Negative for chest pain.   Gastrointestinal: Negative for abdominal pain.   Neurological: Negative for headaches.   All other systems reviewed and are negative.      Medications  Current Outpatient Medications   Medication Sig   ??? norethindrone-ethinyl estradiol (JUNEL FE 1/20) 1 mg-20 mcg (21)/75 mg (7) tab Take 1 Tab by mouth daily.   ??? mupirocin (BACTROBAN) 2 % ointment Apply  to affected area daily.     No current facility-administered medications for this visit.        Past Medical History  Past Medical History:   Diagnosis Date   ??? Anemia    ??? Asthma    ??? Hydradenitis        Surgical History  Past Surgical History:   Procedure Laterality Date   ??? HX OTHER SURGICAL Right     Cyst Removed from Right Leg   ??? HX WISDOM TEETH EXTRACTION            Family History  Family History   Problem Relation Age of Onset   ??? Diabetes Maternal Aunt    ??? Diabetes Paternal Uncle    ??? Diabetes Maternal Grandmother        Social History  Social History     Socioeconomic History   ???  Marital status: SINGLE     Spouse name: Not on file   ??? Number of children: Not on file   ??? Years of education: Not on file   ??? Highest education level: Not on file   Occupational History   ??? Not on file   Social Needs   ??? Financial resource strain: Not on file   ??? Food insecurity     Worry: Not on file     Inability: Not on file   ??? Transportation needs     Medical: Not on file     Non-medical: Not on file   Tobacco Use   ??? Smoking status: Never Smoker   ??? Smokeless tobacco: Never Used   ??? Tobacco comment: vaporizor pen   Substance and Sexual Activity   ??? Alcohol use: Yes     Alcohol/week: 0.0 - 0.8 standard drinks   ??? Drug use: No   ??? Sexual activity: Not on file   Lifestyle   ??? Physical activity     Days per week: Not on file  Minutes per session: Not on file   ??? Stress: Not on file   Relationships   ??? Social Product manager on phone: Not on file     Gets together: Not on file     Attends religious service: Not on file     Active member of club or organization: Not on file     Attends meetings of clubs or organizations: Not on file     Relationship status: Not on file   ??? Intimate partner violence     Fear of current or ex partner: Not on file     Emotionally abused: Not on file     Physically abused: Not on file     Forced sexual activity: Not on file   Other Topics Concern   ??? Not on file   Social History Narrative   ??? Not on file       Social History     Tobacco Use   Smoking Status Never Smoker   Smokeless Tobacco Never Used   Tobacco Comment    vaporizor pen       Allergies  No Known Allergies    Vital Signs  Body mass index is 20.89 kg/m??.  Vitals:    12/04/18 1511   BP: 124/72   Pulse: 74   Temp: 99 ??F (37.2 ??C)   TempSrc: Temporal   SpO2: 99%   Weight: 133 lb 6.4 oz (60.5 kg)   Height: 5\' 7"  (1.702 m)       Physical Exam  Physical Exam  Vitals signs and nursing note reviewed.   Constitutional:       General: She is not in acute distress.     Appearance: Normal appearance.   HENT:      Head:  Normocephalic and atraumatic.      Right Ear: External ear normal.      Left Ear: External ear normal.      Nose: Nose normal.      Mouth/Throat:      Lips: Pink.      Mouth: Mucous membranes are moist.   Eyes:      Conjunctiva/sclera: Conjunctivae normal.      Pupils: Pupils are equal, round, and reactive to light.   Neck:      Musculoskeletal: Normal range of motion.      Thyroid: No thyromegaly.      Trachea: No tracheal deviation.   Cardiovascular:      Rate and Rhythm: Normal rate and regular rhythm.   Pulmonary:      Effort: Pulmonary effort is normal.      Breath sounds: Normal breath sounds. No wheezing, rhonchi or rales.   Musculoskeletal: Normal range of motion.      Right lower leg: No edema.      Left lower leg: No edema.   Lymphadenopathy:      Cervical: No cervical adenopathy.   Skin:     General: Skin is warm and dry.      Capillary Refill: Capillary refill takes less than 2 seconds.      Findings: No rash.      Comments: Simple warts on left foot - two wart(s) treated with topical cryotherapy, appropriate skin response, patient tolerated well.      Neurological:      Mental Status: She is alert and oriented to person, place, and time. Mental status is at baseline.      Gait: Gait is intact.  Deep Tendon Reflexes: Reflexes are normal and symmetric.   Psychiatric:         Mood and Affect: Mood and affect normal.         Assessment and Plan  Diagnoses and all orders for this visit:    1. Encounter for birth control pills maintenance    2. Plantar wart      Current treatment plan is effective.  States she does not need refill today.    Return when off menses for pap.  Follow-up disposition: PRN and 1 year routine.        Percival SpanishAbby Wittenberg FNP-BC

## 2018-12-04 NOTE — Progress Notes (Addendum)
Amanda Perry is a 23 y.o. female who presents with   Chief Complaint   Patient presents with   ??? Gyn Exam     Patient here for pap but on her period   ??? Warts       History of Present Illness  She is feeling well.  No side effects with current  OCP.  No history of problems with hormones.  No personal or family history of blood clots.  Patient does not smoke.  No history of migraines with aura.  She is not currently sexually active.       Skin Problem   The history is provided by the patient. The current episode started more than 1 week ago. The problem occurs constantly. The problem has not changed since onset.Pertinent negatives include no chest pain, no abdominal pain, no headaches and no shortness of breath. Nothing aggravates the symptoms. Nothing relieves the symptoms. Treatments tried: multiple otc. The treatment provided no relief.       Review of Systems  Review of Systems   Respiratory: Negative for shortness of breath.    Cardiovascular: Negative for chest pain.   Gastrointestinal: Negative for abdominal pain.   Neurological: Negative for headaches.   All other systems reviewed and are negative.      Medications  Current Outpatient Medications   Medication Sig   ??? norethindrone-ethinyl estradiol (JUNEL FE 1/20) 1 mg-20 mcg (21)/75 mg (7) tab Take 1 Tab by mouth daily.   ??? mupirocin (BACTROBAN) 2 % ointment Apply  to affected area daily.     No current facility-administered medications for this visit.        Past Medical History  Past Medical History:   Diagnosis Date   ??? Anemia    ??? Asthma    ??? Hydradenitis        Surgical History  Past Surgical History:   Procedure Laterality Date   ??? HX OTHER SURGICAL Right     Cyst Removed from Right Leg   ??? HX WISDOM TEETH EXTRACTION            Family History  Family History   Problem Relation Age of Onset   ??? Diabetes Maternal Aunt    ??? Diabetes Paternal Uncle    ??? Diabetes Maternal Grandmother        Social History  Social History     Socioeconomic History    ??? Marital status: SINGLE     Spouse name: Not on file   ??? Number of children: Not on file   ??? Years of education: Not on file   ??? Highest education level: Not on file   Occupational History   ??? Not on file   Social Needs   ??? Financial resource strain: Not on file   ??? Food insecurity     Worry: Not on file     Inability: Not on file   ??? Transportation needs     Medical: Not on file     Non-medical: Not on file   Tobacco Use   ??? Smoking status: Never Smoker   ??? Smokeless tobacco: Never Used   ??? Tobacco comment: vaporizor pen   Substance and Sexual Activity   ??? Alcohol use: Yes     Alcohol/week: 0.0 - 0.8 standard drinks   ??? Drug use: No   ??? Sexual activity: Not on file   Lifestyle   ??? Physical activity     Days per week: Not on file  Minutes per session: Not on file   ??? Stress: Not on file   Relationships   ??? Social Wellsite geologistconnections     Talks on phone: Not on file     Gets together: Not on file     Attends religious service: Not on file     Active member of club or organization: Not on file     Attends meetings of clubs or organizations: Not on file     Relationship status: Not on file   ??? Intimate partner violence     Fear of current or ex partner: Not on file     Emotionally abused: Not on file     Physically abused: Not on file     Forced sexual activity: Not on file   Other Topics Concern   ??? Not on file   Social History Narrative   ??? Not on file       Social History     Tobacco Use   Smoking Status Never Smoker   Smokeless Tobacco Never Used   Tobacco Comment    vaporizor pen       Allergies  No Known Allergies    Vital Signs  Body mass index is 20.89 kg/m??.  Vitals:    12/04/18 1511   BP: 124/72   Pulse: 74   Temp: 99 ??F (37.2 ??C)   TempSrc: Temporal   SpO2: 99%   Weight: 133 lb 6.4 oz (60.5 kg)   Height: 5\' 7"  (1.702 m)       Physical Exam  Physical Exam  Vitals signs and nursing note reviewed.   Constitutional:       General: She is not in acute distress.     Appearance: Normal appearance.   HENT:       Head: Normocephalic and atraumatic.      Right Ear: External ear normal.      Left Ear: External ear normal.      Nose: Nose normal.      Mouth/Throat:      Lips: Pink.      Mouth: Mucous membranes are moist.   Eyes:      Conjunctiva/sclera: Conjunctivae normal.      Pupils: Pupils are equal, round, and reactive to light.   Neck:      Musculoskeletal: Normal range of motion.      Thyroid: No thyromegaly.      Trachea: No tracheal deviation.   Cardiovascular:      Rate and Rhythm: Normal rate and regular rhythm.   Pulmonary:      Effort: Pulmonary effort is normal.      Breath sounds: Normal breath sounds. No wheezing, rhonchi or rales.   Musculoskeletal: Normal range of motion.      Right lower leg: No edema.      Left lower leg: No edema.   Lymphadenopathy:      Cervical: No cervical adenopathy.   Skin:     General: Skin is warm and dry.      Capillary Refill: Capillary refill takes less than 2 seconds.      Findings: No rash.      Comments: Simple warts on left foot - two wart(s) treated with topical cryotherapy, appropriate skin response, patient tolerated well.      Neurological:      Mental Status: She is alert and oriented to person, place, and time. Mental status is at baseline.      Gait: Gait is intact.  Deep Tendon Reflexes: Reflexes are normal and symmetric.   Psychiatric:         Mood and Affect: Mood and affect normal.         Assessment and Plan  Diagnoses and all orders for this visit:    1. Encounter for birth control pills maintenance    2. Plantar wart      Current treatment plan is effective.  States she does not need refill today.    Return when off menses for pap.  Follow-up disposition: PRN and 1 year routine.        Percival SpanishAbby Wittenberg FNP-BC

## 2018-12-20 ENCOUNTER — Ambulatory Visit: Admit: 2018-12-20 | Discharge: 2018-12-20 | Payer: BLUE CROSS/BLUE SHIELD | Attending: Family | Primary: Family Medicine

## 2018-12-20 ENCOUNTER — Ambulatory Visit: Attending: Family | Primary: Family Medicine

## 2018-12-20 DIAGNOSIS — Z124 Encounter for screening for malignant neoplasm of cervix: Secondary | ICD-10-CM

## 2018-12-20 NOTE — Progress Notes (Signed)
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  aw

## 2018-12-20 NOTE — Progress Notes (Signed)
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  I explained to pt these results and Abby's recommendation to refer to GYN.  Advised pt to call me with any questions.  She voiced understanding, and referral to GYN placed.  AW/db

## 2018-12-20 NOTE — Progress Notes (Signed)
Amanda SledgeKelly Y Perry is a 23 y.o. female who presents with   Chief Complaint   Patient presents with   ??? Gyn Exam     Here for pap    ??? Warts     Still has warts on her toes.       History of Present Illness  Here for pap.  Was on her menses during last OV.  She is feeling well.  Some anxiety and depression related to a bad relationship she is coming out of.  Possibly interested in counseling.  No side effects with current OCP.  No history of problems with hormones.  No personal or family history of blood clots.  Patient does not smoke.  No history of migraines with aura.  She is not currently sexually active.  Two warts to left foot treated with cryo at last OV.  Minimal improvement.  Does not want to go to derm.  Tried multiple at home remedies.        Review of Systems  Review of Systems   Constitutional: Negative for chills, fever and malaise/fatigue.   HENT: Negative for ear pain, sinus pain and sore throat.    Eyes: Negative for discharge and redness.   Respiratory: Negative for cough and shortness of breath.    Cardiovascular: Negative for chest pain, palpitations and leg swelling.   Gastrointestinal: Negative for abdominal pain, blood in stool, constipation, diarrhea, nausea and vomiting.   Genitourinary: Negative for dysuria, frequency, hematuria and urgency.   Musculoskeletal: Negative for falls and myalgias.   Skin: Negative for itching and rash.   Neurological: Negative for dizziness, speech change, focal weakness and headaches.   Endo/Heme/Allergies: Negative for polydipsia.   Psychiatric/Behavioral: Positive for depression. Negative for hallucinations and suicidal ideas. The patient is nervous/anxious. The patient does not have insomnia.        Medications  Current Outpatient Medications   Medication Sig   ??? norethindrone-ethinyl estradiol (JUNEL FE 1/20) 1 mg-20 mcg (21)/75 mg (7) tab Take 1 Tab by mouth daily.   ??? mupirocin (BACTROBAN) 2 % ointment Apply  to affected area daily.     No current  facility-administered medications for this visit.        Past Medical History  Past Medical History:   Diagnosis Date   ??? Anemia    ??? Asthma    ??? Hydradenitis        Surgical History  Past Surgical History:   Procedure Laterality Date   ??? HX OTHER SURGICAL Right     Cyst Removed from Right Leg   ??? HX WISDOM TEETH EXTRACTION            Family History  Family History   Problem Relation Age of Onset   ??? Diabetes Maternal Aunt    ??? Diabetes Paternal Uncle    ??? Diabetes Maternal Grandmother        Social History  Social History     Socioeconomic History   ??? Marital status: SINGLE     Spouse name: Not on file   ??? Number of children: Not on file   ??? Years of education: Not on file   ??? Highest education level: Not on file   Occupational History   ??? Not on file   Social Needs   ??? Financial resource strain: Not on file   ??? Food insecurity     Worry: Not on file     Inability: Not on file   ??? Transportation needs  Medical: Not on file     Non-medical: Not on file   Tobacco Use   ??? Smoking status: Never Smoker   ??? Smokeless tobacco: Never Used   ??? Tobacco comment: vaporizor pen   Substance and Sexual Activity   ??? Alcohol use: Yes     Alcohol/week: 0.0 - 0.8 standard drinks   ??? Drug use: No   ??? Sexual activity: Not on file   Lifestyle   ??? Physical activity     Days per week: Not on file     Minutes per session: Not on file   ??? Stress: Not on file   Relationships   ??? Social Product manager on phone: Not on file     Gets together: Not on file     Attends religious service: Not on file     Active member of club or organization: Not on file     Attends meetings of clubs or organizations: Not on file     Relationship status: Not on file   ??? Intimate partner violence     Fear of current or ex partner: Not on file     Emotionally abused: Not on file     Physically abused: Not on file     Forced sexual activity: Not on file   Other Topics Concern   ??? Not on file   Social History Narrative   ??? Not on file       Social History      Tobacco Use   Smoking Status Never Smoker   Smokeless Tobacco Never Used   Tobacco Comment    vaporizor pen       Allergies  No Known Allergies    Vital Signs  Body mass index is 20.89 kg/m??.  Vitals:    12/20/18 0737   BP: 126/72   Pulse: 88   Temp: 98.1 ??F (36.7 ??C)   TempSrc: Temporal   SpO2: 100%   Weight: 133 lb 6.4 oz (60.5 kg)       Physical Exam  Physical Exam  Vitals signs and nursing note reviewed. Exam conducted with a chaperone present.   Constitutional:       General: She is not in acute distress.     Appearance: Normal appearance.   HENT:      Head: Normocephalic and atraumatic.      Right Ear: Tympanic membrane, ear canal and external ear normal.      Left Ear: Tympanic membrane, ear canal and external ear normal.      Nose: Nose normal.      Mouth/Throat:      Lips: Pink.      Mouth: Mucous membranes are moist.      Pharynx: Oropharynx is clear. Uvula midline. No oropharyngeal exudate or posterior oropharyngeal erythema.   Eyes:      Conjunctiva/sclera: Conjunctivae normal.      Pupils: Pupils are equal, round, and reactive to light.   Neck:      Musculoskeletal: Normal range of motion.      Thyroid: No thyromegaly.      Trachea: No tracheal deviation.   Cardiovascular:      Rate and Rhythm: Normal rate and regular rhythm.      Heart sounds: Normal heart sounds.   Pulmonary:      Effort: Pulmonary effort is normal.      Breath sounds: Normal breath sounds. No wheezing, rhonchi or rales.   Abdominal:  Tenderness: There is no right CVA tenderness.   Genitourinary:     General: Normal vulva.      Vagina: Normal. No vaginal discharge.      Cervix: Normal.      Uterus: Absent. Not fixed and not tender.       Adnexa: Right adnexa normal and left adnexa normal.        Right: No tenderness or fullness.          Left: No tenderness or fullness.        Rectum: Normal.   Musculoskeletal: Normal range of motion.      Right lower leg: No edema.      Left lower leg: No edema.   Lymphadenopathy:       Cervical: No cervical adenopathy.   Skin:     General: Skin is warm and dry.      Capillary Refill: Capillary refill takes less than 2 seconds.      Findings: No rash.   Neurological:      Mental Status: She is alert and oriented to person, place, and time. Mental status is at baseline.      Gait: Gait is intact.      Deep Tendon Reflexes: Reflexes are normal and symmetric.   Psychiatric:         Mood and Affect: Mood and affect normal.     Simple wart on left great and fifth toes - two wart(s) treated with topical cryotherapy, appropriate skin response, patient tolerated well.       Assessment and Plan  Diagnoses and all orders for this visit:    1. Pap smear for cervical cancer screening  -     PAP, LB, RFX HPV ZOXWR([id number])    2. Warts of foot  -     DESTRUC BENIGN LESION, UP TO 14 LESIONS    3. Situational mixed anxiety and depressive disorder      Await pap results.  She will call back if she'd like referral for counseling.  She declines today.  Follow-up as needed and one year routine.        Percival SpanishAbby Wittenberg FNP-BC

## 2018-12-20 NOTE — Progress Notes (Signed)
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  aw

## 2018-12-20 NOTE — Progress Notes (Signed)
Amanda Perry is a 23 y.o. female who presents with   Chief Complaint   Patient presents with   ??? Gyn Exam     Here for pap    ??? Warts     Still has warts on her toes.       History of Present Illness  Here for pap.  Was on her menses during last OV.  She is feeling well.  Some anxiety and depression related to a bad relationship she is coming out of.  Possibly interested in counseling.  No side effects with current OCP.  No history of problems with hormones.  No personal or family history of blood clots.  Patient does not smoke.  No history of migraines with aura.  She is not currently sexually active.  Two warts to left foot treated with cryo at last OV.  Minimal improvement.  Does not want to go to derm.  Tried multiple at home remedies.        Review of Systems  Review of Systems   Constitutional: Negative for chills, fever and malaise/fatigue.   HENT: Negative for ear pain, sinus pain and sore throat.    Eyes: Negative for discharge and redness.   Respiratory: Negative for cough and shortness of breath.    Cardiovascular: Negative for chest pain, palpitations and leg swelling.   Gastrointestinal: Negative for abdominal pain, blood in stool, constipation, diarrhea, nausea and vomiting.   Genitourinary: Negative for dysuria, frequency, hematuria and urgency.   Musculoskeletal: Negative for falls and myalgias.   Skin: Negative for itching and rash.   Neurological: Negative for dizziness, speech change, focal weakness and headaches.   Endo/Heme/Allergies: Negative for polydipsia.   Psychiatric/Behavioral: Positive for depression. Negative for hallucinations and suicidal ideas. The patient is nervous/anxious. The patient does not have insomnia.        Medications  Current Outpatient Medications   Medication Sig   ??? norethindrone-ethinyl estradiol (JUNEL FE 1/20) 1 mg-20 mcg (21)/75 mg (7) tab Take 1 Tab by mouth daily.   ??? mupirocin (BACTROBAN) 2 % ointment Apply  to affected area daily.      No current facility-administered medications for this visit.        Past Medical History  Past Medical History:   Diagnosis Date   ??? Anemia    ??? Asthma    ??? Hydradenitis        Surgical History  Past Surgical History:   Procedure Laterality Date   ??? HX OTHER SURGICAL Right     Cyst Removed from Right Leg   ??? HX WISDOM TEETH EXTRACTION            Family History  Family History   Problem Relation Age of Onset   ??? Diabetes Maternal Aunt    ??? Diabetes Paternal Uncle    ??? Diabetes Maternal Grandmother        Social History  Social History     Socioeconomic History   ??? Marital status: SINGLE     Spouse name: Not on file   ??? Number of children: Not on file   ??? Years of education: Not on file   ??? Highest education level: Not on file   Occupational History   ??? Not on file   Social Needs   ??? Financial resource strain: Not on file   ??? Food insecurity     Worry: Not on file     Inability: Not on file   ??? Transportation needs  Medical: Not on file     Non-medical: Not on file   Tobacco Use   ??? Smoking status: Never Smoker   ??? Smokeless tobacco: Never Used   ??? Tobacco comment: vaporizor pen   Substance and Sexual Activity   ??? Alcohol use: Yes     Alcohol/week: 0.0 - 0.8 standard drinks   ??? Drug use: No   ??? Sexual activity: Not on file   Lifestyle   ??? Physical activity     Days per week: Not on file     Minutes per session: Not on file   ??? Stress: Not on file   Relationships   ??? Social Product manager on phone: Not on file     Gets together: Not on file     Attends religious service: Not on file     Active member of club or organization: Not on file     Attends meetings of clubs or organizations: Not on file     Relationship status: Not on file   ??? Intimate partner violence     Fear of current or ex partner: Not on file     Emotionally abused: Not on file     Physically abused: Not on file     Forced sexual activity: Not on file   Other Topics Concern   ??? Not on file   Social History Narrative   ??? Not on file        Social History     Tobacco Use   Smoking Status Never Smoker   Smokeless Tobacco Never Used   Tobacco Comment    vaporizor pen       Allergies  No Known Allergies    Vital Signs  Body mass index is 20.89 kg/m??.  Vitals:    12/20/18 0737   BP: 126/72   Pulse: 88   Temp: 98.1 ??F (36.7 ??C)   TempSrc: Temporal   SpO2: 100%   Weight: 133 lb 6.4 oz (60.5 kg)       Physical Exam  Physical Exam  Vitals signs and nursing note reviewed. Exam conducted with a chaperone present.   Constitutional:       General: She is not in acute distress.     Appearance: Normal appearance.   HENT:      Head: Normocephalic and atraumatic.      Right Ear: Tympanic membrane, ear canal and external ear normal.      Left Ear: Tympanic membrane, ear canal and external ear normal.      Nose: Nose normal.      Mouth/Throat:      Lips: Pink.      Mouth: Mucous membranes are moist.      Pharynx: Oropharynx is clear. Uvula midline. No oropharyngeal exudate or posterior oropharyngeal erythema.   Eyes:      Conjunctiva/sclera: Conjunctivae normal.      Pupils: Pupils are equal, round, and reactive to light.   Neck:      Musculoskeletal: Normal range of motion.      Thyroid: No thyromegaly.      Trachea: No tracheal deviation.   Cardiovascular:      Rate and Rhythm: Normal rate and regular rhythm.      Heart sounds: Normal heart sounds.   Pulmonary:      Effort: Pulmonary effort is normal.      Breath sounds: Normal breath sounds. No wheezing, rhonchi or rales.   Abdominal:  Tenderness: There is no right CVA tenderness.   Genitourinary:     General: Normal vulva.      Vagina: Normal. No vaginal discharge.      Cervix: Normal.      Uterus: Absent. Not fixed and not tender.       Adnexa: Right adnexa normal and left adnexa normal.        Right: No tenderness or fullness.          Left: No tenderness or fullness.        Rectum: Normal.   Musculoskeletal: Normal range of motion.      Right lower leg: No edema.      Left lower leg: No edema.    Lymphadenopathy:      Cervical: No cervical adenopathy.   Skin:     General: Skin is warm and dry.      Capillary Refill: Capillary refill takes less than 2 seconds.      Findings: No rash.   Neurological:      Mental Status: She is alert and oriented to person, place, and time. Mental status is at baseline.      Gait: Gait is intact.      Deep Tendon Reflexes: Reflexes are normal and symmetric.   Psychiatric:         Mood and Affect: Mood and affect normal.     Simple wart on left great and fifth toes - two wart(s) treated with topical cryotherapy, appropriate skin response, patient tolerated well.       Assessment and Plan  Diagnoses and all orders for this visit:    1. Pap smear for cervical cancer screening  -     PAP, LB, RFX HPV ZOXWR([id number])    2. Warts of foot  -     DESTRUC BENIGN LESION, UP TO 14 LESIONS    3. Situational mixed anxiety and depressive disorder      Await pap results.  She will call back if she'd like referral for counseling.  She declines today.  Follow-up as needed and one year routine.        Percival SpanishAbby Wittenberg FNP-BC

## 2018-12-20 NOTE — Progress Notes (Signed)
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  I explained to pt these results and Abby's recommendation to refer to GYN.  Advised pt to call me with any questions.  She voiced understanding, and referral to GYN placed.  AW/db

## 2019-01-04 LAB — PAP, LB, RFX HPV ASCUS(507301)
.: 0
LABCORP 019018: 0

## 2019-01-04 LAB — HPV HIGH RISK, THIN PREP
HPV DNA Probe, High Risk: POSITIVE — AB
HPV, High Risk: POSITIVE — AB

## 2019-01-07 ENCOUNTER — Encounter

## 2019-01-29 ENCOUNTER — Ambulatory Visit
Admit: 2019-01-29 | Discharge: 2019-01-29 | Payer: BLUE CROSS/BLUE SHIELD | Attending: Obstetrics & Gynecology | Primary: Family Medicine

## 2019-01-29 ENCOUNTER — Ambulatory Visit: Attending: Obstetrics & Gynecology | Primary: Family Medicine

## 2019-01-29 DIAGNOSIS — R87610 Atypical squamous cells of undetermined significance on cytologic smear of cervix (ASC-US): Secondary | ICD-10-CM

## 2019-01-29 LAB — AMB POC URINE PREGNANCY TEST, VISUAL COLOR COMPARISON
HCG urine, Ql. (POC): NEGATIVE
HCG, Pregnancy, Urine, POC: NEGATIVE

## 2019-01-29 NOTE — Progress Notes (Signed)
Progress Notes by Doristine Johns, MD at 01/29/19 0845                Author: Doristine Johns, MD  Service: --  Author Type: Physician       Filed: 01/29/19 1539  Encounter Date: 01/29/2019  Status: Signed          Editor: Doristine Johns, MD (Physician)               Amanda Perry is a 23 y.o.  G0 who presents for consultation regarding her recent pap smear.  Pap obtained about 3 weeks ago shoed ASCUS w +HRHPV.  This is her 2nd pap, but first abnl.  Is sexually active in a newrelation ship.  Previous relationship ended earlier this year. Is  on OCs, and has no GYN c/o.      Amanda Perry  has a past medical history of Abnormal Papanicolaou smear of cervix (01/07/2019),  Anemia, Asthma, and Hydradenitis. .        OB History            Gravida  Para  Term  Preterm  AB  Living            0  0  0  0  0  0            SAB  TAB  Ectopic  Molar  Multiple  Live Births            0  0  0  0  0  0             Her surgeries include  has a past surgical history that includes hx wisdom teeth extraction and hx other surgical (Right)..      Her current meds are      Current Outpatient Medications on File Prior to Visit          Medication  Sig  Dispense  Refill           ?  Biotin 2,500 mcg cap  Take  by mouth.         ?  cholecalciferol (Vitamin D3) 25 mcg (1,000 unit) cap  Take  by mouth daily.         ?  cyanocobalamin (Vitamin B-12) 100 mcg tablet  Take 100 mcg by mouth daily.         ?  B.infantis-B.ani-B.long-B.bifi (Probiotic 4X) 10-15 mg TbEC  Take  by mouth.         ?  ferrous sulfate (IRON PO)  Take  by mouth.         ?  magnesium 250 mg tab  Take  by mouth.         ?  ASHWAGANDHA ROOT EXTRACT,BULK,  by Does Not Apply route.         ?  norethindrone-ethinyl estradiol (JUNEL FE 1/20) 1 mg-20 mcg (21)/75 mg (7) tab  Take 1 Tab by mouth daily.  3 Package  3           ?  mupirocin (BACTROBAN) 2 % ointment  Apply  to affected area daily.  30 g  0          No current facility-administered medications on file prior  to visit.              No Known Allergies       Family history is significant for family history includes Diabetes in her maternal aunt,  maternal grandmother, and paternal uncle.Marland Kitchen      Amanda Perry  reports that she has never smoked. She has quit using smokeless tobacco. She  reports current alcohol use. She reports that she does not use drugs.       She completed her Systems Review which is documented below.  I tried to relate any problem to gyn systems and if not related she is referred to her PCP.       Review of Systems    Constitutional: Negative.  Negative for fever, malaise/fatigue and weight loss.    Respiratory: Negative.     Cardiovascular: Negative.     Gastrointestinal: Negative.  Negative for abdominal pain, blood in stool, constipation, diarrhea, heartburn, nausea and vomiting.    Genitourinary: Negative.  Negative for dysuria, frequency, hematuria and urgency.    Musculoskeletal: Negative.  Negative for falls.    Skin: Negative.     Neurological: Negative.     Endo/Heme/Allergies: Negative.     Psychiatric/Behavioral: Negative.  Negative for depression. The patient is not nervous/anxious.     All other systems reviewed and are negative.       .Blood pressure 110/70, height 5\' 6"  (1.676 m), weight 134 lb (60.8 kg), last menstrual period 12/29/2018.    Body mass index is 21.63 kg/m??.       Physical Exam   Exam conducted with a chaperone present.   Constitutional:        General: She is not in acute distress.     Appearance: Normal appearance. She is normal weight.    HENT:       Head: Normocephalic and atraumatic.   Eyes :       Extraocular Movements: Extraocular movements intact.      Pupils: Pupils are equal, round, and reactive to light.    Neck:       Musculoskeletal: Normal range of motion and neck supple.   Pulmonary:       Effort: Pulmonary effort is normal. No respiratory distress.   Genitourinary :      General: Normal vulva.      Exam position: Lithotomy position.      Labia:         Right: No rash,  tenderness or lesion.         Left: No rash or tenderness.       Vagina: Normal.           Neurological :       General: No focal deficit present.      Mental Status: She is alert and oriented to person, place, and time.    Psychiatric:         Mood and Affect: Mood normal.         Behavior: Behavior normal.         Thought Content: Thought content normal.         Judgment: Judgment normal.           Diagnoses and all orders for this visit:      1. ASCUS with positive high risk HPV cervical - cervical cancer, "pre-cancer", HPV involvement/transmission and use of pap smears discussed at length.  Treatment v observation of SIL discussed.  AFter full consultation, patient elects to proceed with colposcopy today while here.  See above.  Will contact with results   -     SURGICAL PATHOLOGY   -     COLPOSC,CERVIX W/ADJ VAG,W/BX & CURRETAG      2. Pre-procedure  lab exam   -     AMB POC URINE PREGNANCY TEST, VISUAL COLOR COMPARISON       Length of visit 20 minutes  > 50% of visit spent in face to face counseling and coordination of care for the above.         Follow-up and Dispositions      ??  Return in about 6 months (around 07/29/2019) for Colposcopy.

## 2019-01-29 NOTE — Progress Notes (Signed)
Amanda Perry is a 23 y.o. G0 who presents for consultation regarding her recent pap smear.  Pap obtained about 3 weeks ago shoed ASCUS w +HRHPV.  This is her 2nd pap, but first abnl.  Is sexually active in a newrelation ship.  Previous relationship ended earlier this year. Is on OCs, and has no GYN c/o.    Amanda Perry  has a past medical history of Abnormal Papanicolaou smear of cervix (01/07/2019), Anemia, Asthma, and Hydradenitis. .    OB History   Gravida Para Term Preterm AB Living   0 0 0 0 0 0   SAB TAB Ectopic Molar Multiple Live Births   0 0 0 0 0 0         Her surgeries include  has a past surgical history that includes hx wisdom teeth extraction and hx other surgical (Right)..    Her current meds are   Current Outpatient Medications on File Prior to Visit   Medication Sig Dispense Refill   ??? Biotin 2,500 mcg cap Take  by mouth.     ??? cholecalciferol (Vitamin D3) 25 mcg (1,000 unit) cap Take  by mouth daily.     ??? cyanocobalamin (Vitamin B-12) 100 mcg tablet Take 100 mcg by mouth daily.     ??? B.infantis-B.ani-B.long-B.bifi (Probiotic 4X) 10-15 mg TbEC Take  by mouth.     ??? ferrous sulfate (IRON PO) Take  by mouth.     ??? magnesium 250 mg tab Take  by mouth.     ??? ASHWAGANDHA ROOT EXTRACT,BULK, by Does Not Apply route.     ??? norethindrone-ethinyl estradiol (JUNEL FE 1/20) 1 mg-20 mcg (21)/75 mg (7) tab Take 1 Tab by mouth daily. 3 Package 3   ??? mupirocin (BACTROBAN) 2 % ointment Apply  to affected area daily. 30 g 0     No current facility-administered medications on file prior to visit.          No Known Allergies     Family history is significant for family history includes Diabetes in her maternal aunt, maternal grandmother, and paternal uncle.Marland Kitchen.    Amanda Perry  reports that she has never smoked. She has quit using smokeless tobacco. She reports current alcohol use. She reports that she does not use drugs.     She completed her Systems Review which is documented below.  I tried to  relate any problem to gyn systems and if not related she is referred to her PCP.     Review of Systems   Constitutional: Negative.  Negative for fever, malaise/fatigue and weight loss.   Respiratory: Negative.    Cardiovascular: Negative.    Gastrointestinal: Negative.  Negative for abdominal pain, blood in stool, constipation, diarrhea, heartburn, nausea and vomiting.   Genitourinary: Negative.  Negative for dysuria, frequency, hematuria and urgency.   Musculoskeletal: Negative.  Negative for falls.   Skin: Negative.    Neurological: Negative.    Endo/Heme/Allergies: Negative.    Psychiatric/Behavioral: Negative.  Negative for depression. The patient is not nervous/anxious.    All other systems reviewed and are negative.     .Blood pressure 110/70, height 5\' 6"  (1.676 m), weight 134 lb (60.8 kg), last menstrual period 12/29/2018.   Body mass index is 21.63 kg/m??.     Physical Exam  Exam conducted with a chaperone present.   Constitutional:       General: She is not in acute distress.     Appearance: Normal appearance. She is normal weight.  HENT:      Head: Normocephalic and atraumatic.   Eyes:      Extraocular Movements: Extraocular movements intact.      Pupils: Pupils are equal, round, and reactive to light.   Neck:      Musculoskeletal: Normal range of motion and neck supple.   Pulmonary:      Effort: Pulmonary effort is normal. No respiratory distress.   Genitourinary:     General: Normal vulva.      Exam position: Lithotomy position.      Labia:         Right: No rash, tenderness or lesion.         Left: No rash or tenderness.       Vagina: Normal.         Neurological:      General: No focal deficit present.      Mental Status: She is alert and oriented to person, place, and time.   Psychiatric:         Mood and Affect: Mood normal.         Behavior: Behavior normal.         Thought Content: Thought content normal.         Judgment: Judgment normal.        Diagnoses and all orders for this visit:     1. ASCUS with positive high risk HPV cervical - cervical cancer, "pre-cancer", HPV involvement/transmission and use of pap smears discussed at length.  Treatment v observation of SIL discussed. AFter full consultation, patient elects to proceed with colposcopy today while here.  See above.  Will contact with results  -     [place] W/ADJ VAG,W/BX & CURRETAG    2. Pre-procedure lab exam  -     AMB POC URINE PREGNANCY TEST, VISUAL COLOR COMPARISON     Length of visit 20 minutes  > 50% of visit spent in face to face counseling and coordination of care for the above.     Follow-up and Dispositions    ?? Return in about 6 months (around 07/29/2019) for Colposcopy.

## 2019-02-13 LAB — SURGICAL PATHOLOGY
PDF IMAGE, 807507: 0
PDF Image: 0

## 2019-02-18 MED ORDER — METRONIDAZOLE 0.75 % VAGINAL GEL
0.75 % | Freq: Every evening | VAGINAL | 0 refills | Status: AC
Start: 2019-02-18 — End: 2019-02-23

## 2019-03-15 ENCOUNTER — Telehealth

## 2019-03-15 MED ORDER — NORETHINDRONE-ETHINYL ESTRADIOL-IRON 1 MG-20 MCG TAB
1 mg-20 mcg (2)/75 mg (7) | PACK | Freq: Every day | ORAL | 3 refills | Status: DC
Start: 2019-03-15 — End: 2020-02-18

## 2019-03-15 NOTE — Telephone Encounter (Signed)
Medication sent into pharmacy per Abby

## 2019-03-15 NOTE — Telephone Encounter (Signed)
Saw Abby NP in June. Needs BC Estradiol refill.   [facility] [address] [place].

## 2019-06-06 NOTE — Telephone Encounter (Signed)
Patient called with questions regarding her pap smear results. Please call her at [phone]

## 2019-06-11 NOTE — Telephone Encounter (Signed)
Pt has not returned my call. Encounter closed.

## 2019-06-14 ENCOUNTER — Ambulatory Visit: Admit: 2019-06-14 | Discharge: 2019-06-14 | Payer: BLUE CROSS/BLUE SHIELD | Attending: Family | Primary: Family Medicine

## 2019-06-14 ENCOUNTER — Ambulatory Visit: Attending: Family | Primary: Family Medicine

## 2019-06-14 DIAGNOSIS — Z113 Encounter for screening for infections with a predominantly sexual mode of transmission: Secondary | ICD-10-CM

## 2019-06-14 NOTE — Progress Notes (Signed)
Pt notified by me negative.  aw

## 2019-06-14 NOTE — Progress Notes (Signed)
Amanda Perry is a 24 y.o. female who presents with   Chief Complaint   Patient presents with   ??? Labs       History of Present Illness  Pap with ASCUS and positive HPV 11/2018.  Had colposcopy with GYN which showed inflammation.  Rx'ed Metrogel.  Repeat scheduled for 3/1.  Getting married in September.  Wants STD testing.  Asymptomatic.  No other concerns today.    Review of Systems  Review of Systems   Constitutional: Negative for chills and fever.   HENT: Negative for ear pain, sinus pain and sore throat.    Eyes: Negative for discharge and redness.   Respiratory: Negative for cough and shortness of breath.    Cardiovascular: Negative for chest pain and palpitations.   Gastrointestinal: Negative for abdominal pain, constipation, diarrhea, nausea and vomiting.   Genitourinary: Negative for dysuria, frequency and urgency.   Musculoskeletal: Negative for falls.   Skin: Negative for itching and rash.   Neurological: Negative for dizziness, speech change, focal weakness and headaches.   Endo/Heme/Allergies: Negative for polydipsia.   Psychiatric/Behavioral: Negative for hallucinations and suicidal ideas.       Medications  Current Outpatient Medications   Medication Sig   ??? norethindrone-ethinyl estradiol (JUNEL FE 1/20) 1 mg-20 mcg (21)/75 mg (7) tab Take 1 Tab by mouth daily.   ??? Biotin 2,500 mcg cap Take  by mouth.   ??? cholecalciferol (Vitamin D3) 25 mcg (1,000 unit) cap Take  by mouth daily.   ??? cyanocobalamin (Vitamin B-12) 100 mcg tablet Take 100 mcg by mouth daily.   ??? B.infantis-B.ani-B.long-B.bifi (Probiotic 4X) 10-15 mg TbEC Take  by mouth.   ??? ferrous sulfate (IRON PO) Take  by mouth.   ??? magnesium 250 mg tab Take  by mouth.   ??? ASHWAGANDHA ROOT EXTRACT,BULK, by Does Not Apply route.   ??? mupirocin (BACTROBAN) 2 % ointment Apply  to affected area daily.     No current facility-administered medications for this visit.        Past Medical History  Past Medical History:   Diagnosis Date   ??? Abnormal  Papanicolaou smear of cervix 01/07/2019    ASCUS + HRHPV   ??? Anemia    ??? Asthma    ??? Hydradenitis        Surgical History  Past Surgical History:   Procedure Laterality Date   ??? HX OTHER SURGICAL Right     Cyst Removed from Right Leg   ??? HX WISDOM TEETH EXTRACTION            Family History  Family History   Problem Relation Age of Onset   ??? Diabetes Maternal Aunt    ??? Diabetes Paternal Uncle    ??? Diabetes Maternal Grandmother    ??? Breast Cancer Neg Hx    ??? Colon Cancer Neg Hx    ??? Ovarian Cancer Neg Hx        Social History  Social History     Socioeconomic History   ??? Marital status: SINGLE     Spouse name: Not on file   ??? Number of children: Not on file   ??? Years of education: Not on file   ??? Highest education level: Not on file   Occupational History   ??? Not on file   Social Needs   ??? Financial resource strain: Not on file   ??? Food insecurity     Worry: Not on file  Inability: Not on file   ??? Transportation needs     Medical: Not on file     Non-medical: Not on file   Tobacco Use   ??? Smoking status: Never Smoker   ??? Smokeless tobacco: Former Estate agent and Sexual Activity   ??? Alcohol use: Yes     Alcohol/week: 0.0 - 0.8 standard drinks   ??? Drug use: No   ??? Sexual activity: Yes     Partners: Male     Birth control/protection: Pill   Lifestyle   ??? Physical activity     Days per week: Not on file     Minutes per session: Not on file   ??? Stress: Not on file   Relationships   ??? Social Wellsite geologist on phone: Not on file     Gets together: Not on file     Attends religious service: Not on file     Active member of club or organization: Not on file     Attends meetings of clubs or organizations: Not on file     Relationship status: Not on file   ??? Intimate partner violence     Fear of current or ex partner: Not on file     Emotionally abused: Not on file     Physically abused: Not on file     Forced sexual activity: Not on file   Other Topics Concern   ??? Not on file   Social History Narrative   ??? Not on  file       Social History     Tobacco Use   Smoking Status Never Smoker   Smokeless Tobacco Former User       Allergies  No Known Allergies    Vital Signs  Body mass index is 21.79 kg/m??.  Vitals:    06/14/19 0735   BP: 120/70   Pulse: 68   Temp: (!) 96 ??F (35.6 ??C)   SpO2: 98%   Weight: 135 lb (61.2 kg)       Physical Exam  Physical Exam  Vitals signs and nursing note reviewed.   Constitutional:       General: She is not in acute distress.     Appearance: Normal appearance.   HENT:      Head: Normocephalic and atraumatic.      Right Ear: External ear normal.      Left Ear: External ear normal.      Nose: Nose normal.      Mouth/Throat:      Lips: Pink.      Mouth: Mucous membranes are moist.   Eyes:      Conjunctiva/sclera: Conjunctivae normal.      Pupils: Pupils are equal, round, and reactive to light.   Neck:      Musculoskeletal: Normal range of motion.      Thyroid: No thyromegaly.      Trachea: No tracheal deviation.   Cardiovascular:      Rate and Rhythm: Normal rate and regular rhythm.      Heart sounds: Normal heart sounds.   Pulmonary:      Effort: Pulmonary effort is normal.      Breath sounds: Normal breath sounds. No wheezing, rhonchi or rales.   Musculoskeletal: Normal range of motion.      Right lower leg: No edema.      Left lower leg: No edema.   Lymphadenopathy:      Cervical: No  cervical adenopathy.   Skin:     General: Skin is warm and dry.      Capillary Refill: Capillary refill takes less than 2 seconds.      Findings: No rash.   Neurological:      Mental Status: She is alert and oriented to person, place, and time. Mental status is at baseline.      Gait: Gait is intact.      Deep Tendon Reflexes: Reflexes are normal and symmetric.   Psychiatric:         Mood and Affect: Mood and affect normal.         Assessment and Plan  Diagnoses and all orders for this visit:    1. Screening for STD (sexually transmitted disease)  -     HIV 1/2 AG/AB, 4TH GENERATION,W RFLX CONFIRM  -     HSV 1/2 AB,  IGG/IGM  -     RPR  -     CT/NG/T.VAGINALIS AMPLIFICATION  -     HEPATITIS C AB    2. History of abnormal cervical Pap smear    3. History of HPV infection      All questions answered.  Await pending labs.  Keep scheduled follow-up with GYN 3/1.  Follow-up here as needed and routinely with PCP.      Percival Spanish FNP-BC

## 2019-06-14 NOTE — Progress Notes (Signed)
Amanda Perry is a 24 y.o. female who presents with   Chief Complaint   Patient presents with   ??? Labs       History of Present Illness  Pap with ASCUS and positive HPV 11/2018.  Had colposcopy with GYN which showed inflammation.  Rx'ed Metrogel.  Repeat scheduled for 3/1.  Getting married in September.  Wants STD testing.  Asymptomatic.  No other concerns today.    Review of Systems  Review of Systems   Constitutional: Negative for chills and fever.   HENT: Negative for ear pain, sinus pain and sore throat.    Eyes: Negative for discharge and redness.   Respiratory: Negative for cough and shortness of breath.    Cardiovascular: Negative for chest pain and palpitations.   Gastrointestinal: Negative for abdominal pain, constipation, diarrhea, nausea and vomiting.   Genitourinary: Negative for dysuria, frequency and urgency.   Musculoskeletal: Negative for falls.   Skin: Negative for itching and rash.   Neurological: Negative for dizziness, speech change, focal weakness and headaches.   Endo/Heme/Allergies: Negative for polydipsia.   Psychiatric/Behavioral: Negative for hallucinations and suicidal ideas.       Medications  Current Outpatient Medications   Medication Sig   ??? norethindrone-ethinyl estradiol (JUNEL FE 1/20) 1 mg-20 mcg (21)/75 mg (7) tab Take 1 Tab by mouth daily.   ??? Biotin 2,500 mcg cap Take  by mouth.   ??? cholecalciferol (Vitamin D3) 25 mcg (1,000 unit) cap Take  by mouth daily.   ??? cyanocobalamin (Vitamin B-12) 100 mcg tablet Take 100 mcg by mouth daily.   ??? B.infantis-B.ani-B.long-B.bifi (Probiotic 4X) 10-15 mg TbEC Take  by mouth.   ??? ferrous sulfate (IRON PO) Take  by mouth.   ??? magnesium 250 mg tab Take  by mouth.   ??? ASHWAGANDHA ROOT EXTRACT,BULK, by Does Not Apply route.   ??? mupirocin (BACTROBAN) 2 % ointment Apply  to affected area daily.     No current facility-administered medications for this visit.        Past Medical History  Past Medical History:   Diagnosis Date    ??? Abnormal Papanicolaou smear of cervix 01/07/2019    ASCUS + HRHPV   ??? Anemia    ??? Asthma    ??? Hydradenitis        Surgical History  Past Surgical History:   Procedure Laterality Date   ??? HX OTHER SURGICAL Right     Cyst Removed from Right Leg   ??? HX WISDOM TEETH EXTRACTION            Family History  Family History   Problem Relation Age of Onset   ??? Diabetes Maternal Aunt    ??? Diabetes Paternal Uncle    ??? Diabetes Maternal Grandmother    ??? Breast Cancer Neg Hx    ??? Colon Cancer Neg Hx    ??? Ovarian Cancer Neg Hx        Social History  Social History     Socioeconomic History   ??? Marital status: SINGLE     Spouse name: Not on file   ??? Number of children: Not on file   ??? Years of education: Not on file   ??? Highest education level: Not on file   Occupational History   ??? Not on file   Social Needs   ??? Financial resource strain: Not on file   ??? Food insecurity     Worry: Not on file  Inability: Not on file   ??? Transportation needs     Medical: Not on file     Non-medical: Not on file   Tobacco Use   ??? Smoking status: Never Smoker   ??? Smokeless tobacco: Former Estate agent and Sexual Activity   ??? Alcohol use: Yes     Alcohol/week: 0.0 - 0.8 standard drinks   ??? Drug use: No   ??? Sexual activity: Yes     Partners: Male     Birth control/protection: Pill   Lifestyle   ??? Physical activity     Days per week: Not on file     Minutes per session: Not on file   ??? Stress: Not on file   Relationships   ??? Social Wellsite geologist on phone: Not on file     Gets together: Not on file     Attends religious service: Not on file     Active member of club or organization: Not on file     Attends meetings of clubs or organizations: Not on file     Relationship status: Not on file   ??? Intimate partner violence     Fear of current or ex partner: Not on file     Emotionally abused: Not on file     Physically abused: Not on file     Forced sexual activity: Not on file   Other Topics Concern   ??? Not on file    Social History Narrative   ??? Not on file       Social History     Tobacco Use   Smoking Status Never Smoker   Smokeless Tobacco Former User       Allergies  No Known Allergies    Vital Signs  Body mass index is 21.79 kg/m??.  Vitals:    06/14/19 0735   BP: 120/70   Pulse: 68   Temp: (!) 96 ??F (35.6 ??C)   SpO2: 98%   Weight: 135 lb (61.2 kg)       Physical Exam  Physical Exam  Vitals signs and nursing note reviewed.   Constitutional:       General: She is not in acute distress.     Appearance: Normal appearance.   HENT:      Head: Normocephalic and atraumatic.      Right Ear: External ear normal.      Left Ear: External ear normal.      Nose: Nose normal.      Mouth/Throat:      Lips: Pink.      Mouth: Mucous membranes are moist.   Eyes:      Conjunctiva/sclera: Conjunctivae normal.      Pupils: Pupils are equal, round, and reactive to light.   Neck:      Musculoskeletal: Normal range of motion.      Thyroid: No thyromegaly.      Trachea: No tracheal deviation.   Cardiovascular:      Rate and Rhythm: Normal rate and regular rhythm.      Heart sounds: Normal heart sounds.   Pulmonary:      Effort: Pulmonary effort is normal.      Breath sounds: Normal breath sounds. No wheezing, rhonchi or rales.   Musculoskeletal: Normal range of motion.      Right lower leg: No edema.      Left lower leg: No edema.   Lymphadenopathy:      Cervical: No  cervical adenopathy.   Skin:     General: Skin is warm and dry.      Capillary Refill: Capillary refill takes less than 2 seconds.      Findings: No rash.   Neurological:      Mental Status: She is alert and oriented to person, place, and time. Mental status is at baseline.      Gait: Gait is intact.      Deep Tendon Reflexes: Reflexes are normal and symmetric.   Psychiatric:         Mood and Affect: Mood and affect normal.         Assessment and Plan  Diagnoses and all orders for this visit:    1. Screening for STD (sexually transmitted disease)   -     HIV 1/2 AG/AB, 4TH GENERATION,W RFLX CONFIRM  -     HSV 1/2 AB, IGG/IGM  -     RPR  -     CT/NG/T.VAGINALIS AMPLIFICATION  -     HEPATITIS C AB    2. History of abnormal cervical Pap smear    3. History of HPV infection      All questions answered.  Await pending labs.  Keep scheduled follow-up with GYN 3/1.  Follow-up here as needed and routinely with PCP.      Percival Spanish FNP-BC

## 2019-06-14 NOTE — Progress Notes (Signed)
Pt notified by me negative.  aw

## 2019-06-15 LAB — RPR
RPR: NONREACTIVE
RPR: NONREACTIVE

## 2019-06-17 LAB — CT/NG/T.VAGINALIS AMPLIFICATION
C. trachomatis by NAA: NEGATIVE
CHLAMYDIA BY NAA, 183161: NEGATIVE
GONOCOCCUS BY NAA, 183162: NEGATIVE
N. gonorrhoeae by NAA: NEGATIVE
T. vaginalis by NAA: NEGATIVE
TRICH VAG BY NAA: NEGATIVE

## 2019-06-18 LAB — HSV 1/2 AB, IGG/IGM
HSV 1 Ab, IgG, type spec.: <0.91 {index} (ref 0.00–0.90)
HSV 1 IGG,TYPE SPEC, 164899: <0.91 index (ref 0.00–0.90)
HSV 2 Ab IgG, type spec.: <0.91 {index} (ref 0.00–0.90)
HSV 2 IGG, TYPE SPEC, 163153: <0.91 index (ref 0.00–0.90)
HSV I/II AB, IGM: 1.56 Ratio — ABNORMAL HIGH (ref 0.00–0.90)
HSV I/II Ab, IgM: 1.56 Ratio — ABNORMAL HIGH (ref 0.00–0.90)

## 2019-06-18 LAB — HIV 1/2 AG/AB, 4TH GENERATION,W RFLX CONFIRM: HIV SCREEN 4TH GENERATION WRFX: NONREACTIVE

## 2019-06-18 LAB — HEPATITIS C AB: Hep C Virus Ab: <0.1 s/co ratio (ref 0.0–0.9)

## 2019-06-18 LAB — HEPATITIS C ANTIBODY: HCV Ab: <0.1 s/co ratio (ref 0.0–0.9)

## 2019-06-18 LAB — HIV 1/2 ANTIGEN/ANTIBODY, FOURTH GENERATION W/RFL: HIV Screen 4th Generation wRfx: NONREACTIVE

## 2019-06-19 NOTE — Telephone Encounter (Signed)
Pt called in wanting the nurse or Abby to call her when they can about some of her results from her STD panel.     [phone]

## 2019-06-20 LAB — HSV 1 AND 2 ABS, IGM
HSV 1 IgM Antibodies: <1:10 {titer}
HSV 2 IgM Antibodies: <1:10 {titer}

## 2019-06-20 LAB — SPECIMEN STATUS REPORT

## 2019-06-21 NOTE — Telephone Encounter (Signed)
Spoke with pt. States she already spoke with someone and all of her questions have been answered.

## 2019-07-29 ENCOUNTER — Encounter: Attending: Obstetrics & Gynecology | Primary: Family Medicine

## 2019-07-29 ENCOUNTER — Encounter: Payer: BLUE CROSS/BLUE SHIELD | Attending: Obstetrics & Gynecology | Primary: Family Medicine

## 2019-09-26 ENCOUNTER — Ambulatory Visit
Admit: 2019-09-26 | Discharge: 2019-09-26 | Payer: BLUE CROSS/BLUE SHIELD | Attending: Obstetrics & Gynecology | Primary: Family Medicine

## 2019-09-26 ENCOUNTER — Ambulatory Visit: Attending: Obstetrics & Gynecology | Primary: Family Medicine

## 2019-09-26 DIAGNOSIS — R87610 Atypical squamous cells of undetermined significance on cytologic smear of cervix (ASC-US): Secondary | ICD-10-CM

## 2019-09-26 LAB — AMB POC URINE PREGNANCY TEST, VISUAL COLOR COMPARISON
HCG urine, Ql. (POC): NEGATIVE
HCG, Pregnancy, Urine, POC: NEGATIVE

## 2019-09-26 NOTE — Progress Notes (Signed)
Progress Notes by Tomasa Hosteller, MD at 09/26/19 1440                Author: Tomasa Hosteller, MD  Service: --  Author Type: Physician       Filed: 09/29/19 1912  Encounter Date: 09/26/2019  Status: Signed          Editor: Tomasa Hosteller, MD (Physician)               Procedure Note   Genitourinary:    Vagina normal.   There is no lesion on the right labia. There is no lesion on the left labia.            BX x 2 + ECC   Diagnoses and all orders for this visit:      1. ASCUS with positive high risk HPV cervical - colpo findings suggestive of HGSIL.  FIndings discussed, likely will need LEEP procedure.  Will call w results.     -     AMB POC URINE PREGNANCY TEST, VISUAL COLOR COMPARISON   -     SURGICAL PATHOLOGY   -     COLPOSC,CERVIX W/ADJ VAG,W/BX & CURRETAG      2. Pre-procedure lab exam   -     AMB POC URINE PREGNANCY TEST, VISUAL COLOR COMPARISON       As I was consulting patient re: findings, she lost consciousness and was eased back onto the exam chair.  Legs were brought up.  My nurse returned to room to assist.  She resuscitated  almost immediately.  Juice and snack administered and was observed for ~ 30 minutes prior to being allowed to leave with family member.

## 2019-09-26 NOTE — Progress Notes (Signed)
Procedure Note   Genitourinary:    Vagina normal.   There is no lesion on the right labia. There is no lesion on the left labia.         BX x 2 + ECC  Diagnoses and all orders for this visit:    1. ASCUS with positive high risk HPV cervical - colpo findings suggestive of HGSIL.  FIndings discussed, likely will need LEEP procedure.  Will call w results.    -     AMB POC URINE PREGNANCY TEST, VISUAL COLOR COMPARISON  -     SURGICAL PATHOLOGY  -     COLPOSC,CERVIX W/ADJ VAG,W/BX & CURRETAG    2. Pre-procedure lab exam  -     AMB POC URINE PREGNANCY TEST, VISUAL COLOR COMPARISON     As I was consulting patient re: findings, she lost consciousness and was eased back onto the exam chair.  Legs were brought up.  My nurse returned to room to assist.  She resuscitated almost immediately.  Juice and snack administered and was observed for ~ 30 minutes prior to being allowed to leave with family member.

## 2019-10-03 LAB — SURGICAL PATHOLOGY

## 2020-02-18 ENCOUNTER — Encounter

## 2020-02-18 MED ORDER — JUNEL FE 1/20 (28) 1 MG-20 MCG (21)/75 MG (7) TABLET
1 mg-20 mcg (2)/75 mg (7) | ORAL_TABLET | ORAL | 3 refills | Status: AC
Start: 2020-02-18 — End: ?

## 2020-03-13 ENCOUNTER — Ambulatory Visit
Admit: 2020-03-13 | Discharge: 2020-03-13 | Payer: BLUE CROSS/BLUE SHIELD | Attending: Obstetrics & Gynecology | Primary: Family Medicine

## 2020-03-13 ENCOUNTER — Ambulatory Visit: Attending: Obstetrics & Gynecology | Primary: Family Medicine

## 2020-03-13 DIAGNOSIS — R877 Abnormal histological findings in specimens from female genital organs: Secondary | ICD-10-CM

## 2020-03-13 LAB — AMB POC URINE PREGNANCY TEST, VISUAL COLOR COMPARISON
HCG urine, Ql. (POC): NEGATIVE
HCG, Pregnancy, Urine, POC: NEGATIVE

## 2020-03-13 NOTE — Progress Notes (Signed)
Progress Notes by Tomasa Hosteller, MD at 03/13/20 1110                Author: Tomasa Hosteller, MD  Service: --  Author Type: Physician       Filed: 03/13/20 1301  Encounter Date: 03/13/2020  Status: Signed          Editor: Tomasa Hosteller, MD (Physician)               Amanda Perry presents for colpo d/t recent bx showing LSIL .  The relationship between abnormal paps, HPV and cervical cancer are discussed. She is a nonsmoker.      She has received the first (1/3) Gardisil Vaccine.      Physical Exam   Exam conducted with a chaperone present.   Constitutional:        General: She is not in acute distress.     Appearance: Normal appearance.    Genitourinary:      General: Normal vulva.      Vagina: Normal.              Comments: Extensive AW areas extending from roughly 50% of TZ as depicted.  Small area of mosaicism noted.  BX x 3 + ECC.  Neurological:       Mental Status: She is alert.   Psychiatric :         Attention and Perception: Attention and perception normal.             Diagnoses and all orders for this visit:      1. 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  Risk of LEEP procedure discussed.   -     SURGICAL PATHOLOGY   -     COLPOSC,CERVIX W/ADJ VAG,W/BX & CURRETAG      2. Pre-procedure lab exam   -     AMB POC URINE PREGNANCY TEST, VISUAL COLOR COMPARISON               Follow-up and Dispositions      ??  Return in about 6 months (around 09/11/2020) for Colposcopy.

## 2020-03-19 LAB — SURGICAL PATHOLOGY

## 2020-11-25 ENCOUNTER — Encounter: Attending: Obstetrics & Gynecology | Primary: Family Medicine

## 2020-12-03 ENCOUNTER — Encounter: Attending: Obstetrics & Gynecology | Primary: Family Medicine

## 2020-12-11 ENCOUNTER — Encounter: Attending: Obstetrics & Gynecology | Primary: Family Medicine

## 2020-12-22 ENCOUNTER — Ambulatory Visit
Admit: 2020-12-22 | Discharge: 2020-12-22 | Payer: BLUE CROSS/BLUE SHIELD | Attending: Obstetrics & Gynecology | Primary: Family Medicine

## 2020-12-22 DIAGNOSIS — N870 Mild cervical dysplasia: Secondary | ICD-10-CM

## 2020-12-22 LAB — AMB POC URINE PREGNANCY TEST, VISUAL COLOR COMPARISON: HCG, Pregnancy, Urine, POC: NEGATIVE

## 2020-12-22 NOTE — Progress Notes (Signed)
Amanda Perry presents for colpo d/t recent pap showing LSIL and colpo confirmed at that time (02/2020) .  The relationship between abnormal paps, HPV and cervical cancer are reviewed.    Amanda Perry has not received the [employer]. (Maybe got the first, unsure)    Physical Exam  Exam conducted with a chaperone present.   Constitutional:       General: Amanda Perry is not in acute distress.     Appearance: Normal appearance.   Genitourinary:     General: Normal vulva.      Exam position: Lithotomy position.      Labia:         Right: No lesion.         Left: No lesion.       Vagina: Normal. No lesions.      Cervix: Lesion present.            Comments: Large extensive AW area with punctions throught.  Mild mosaicism 10:00. ECC obtained    ECC, Bx x 3 (6, 10 & 2)  Neurological:      General: No focal deficit present.      Mental Status: Amanda Perry is alert and oriented to person, place, and time.   Psychiatric:         Mood and Affect: Mood normal.         Behavior: Behavior normal.         Thought Content: Thought content normal.         Adequate colposcopic exam: Yes  Amanda Perry was seen today for colposcopy.    Diagnoses and all orders for this visit:    Dysplasia of cervix, low grade (CIN 1) - Pt counseled re today's findings. first pos Bx 08/2019.  Initial ASCUS +HPV pap was ASCUS + HPV in 11/2018. On colpo today. appears to be worsening, now extending over entire top half of cvx.  Rec low threshold for tx.  Long discussion re HPV, Cervical dysplasia/Ca and treatment options, as well as risks of the procedures and risks to future pregnancy/ PTL risks including cerclage.  They are considering to start a family in the next year or so.  Given all the above I recommend proceeding with LEEP.  Amanda Perry had vagal reaction following colpo today.  IS very nervous about in-office LEEP procedure and wishes to proceed with LEEP under [facility].     Pre-procedure lab exam  -     AMB POC URINE PREGNANCY TEST, VISUAL COLOR COMPARISON    LGSIL on Pap smear of cervix  -      AMB POC URINE PREGNANCY TEST, VISUAL COLOR COMPARISON  -     COLPOSC,CERVIX W/ADJ VAG,W/BX & CURRETAG  -     [facility] Surgical Pathology; Future  -     [facility] Surgical Pathology    History of abnormal cervical Pap smear     Total time spent on this encounter, , d/t Additional 30 minutes of counseling, see above.    No follow-ups on file.      LEEP in office v LEEP in hospital, would need insurance approval for that.

## 2020-12-31 NOTE — Telephone Encounter (Addendum)
Attempted to contact patient to discuss possible dates for recommended surgery per Dr Sheran Spine (LEEP). N/A, L/M on voicemail for patient to return my call at her earliest convenience.    Pt returned call and surgery will be scheduled 01-22-21.

## 2021-01-05 NOTE — Telephone Encounter (Signed)
Yes, cancer risk is very low. He can see his physician.

## 2021-01-18 ENCOUNTER — Ambulatory Visit
Admit: 2021-01-18 | Discharge: 2021-01-18 | Payer: BLUE CROSS/BLUE SHIELD | Attending: Obstetrics & Gynecology | Primary: Family Medicine

## 2021-01-18 ENCOUNTER — Encounter: Payer: BLUE CROSS/BLUE SHIELD | Attending: Obstetrics & Gynecology | Primary: Family Medicine

## 2021-01-18 DIAGNOSIS — N870 Mild cervical dysplasia: Secondary | ICD-10-CM

## 2021-01-18 NOTE — H&P (Addendum)
Gynecology History and Physical    Name: Amanda Perry MRN: [id number] SSN: [id number]    Date of Birth: Nov 26, 1995  Age: 25 y.o.  Sex: female       Subjective:      Chief complaint:  Cervical dysplasia    Amanda Perry is a 25 y.o. Caucasian female with a history of dysplasia on recent colpo showing extensive lesion over entire anterior cervix.  We have followed for nearly 2 yrs.    OB History       Gravida   0    Para   0    Term   0    Preterm   0    AB   0    Living   0         SAB   0    IAB   0    Ectopic   0    Molar   0    Multiple   0    Live Births   0              Past Medical History:   Diagnosis Date    Abnormal Pap smear of cervix     Anemia     Asthma     Cervical dysplasia, mild 01/07/2019    ASCUS + HRHPV    Hydradenitis      Past Surgical History:   Procedure Laterality Date    OTHER SURGICAL HISTORY Right     Cyst Removed from Right Leg    WISDOM TOOTH EXTRACTION       Social History     Occupational History    Not on file   Tobacco Use    Smoking status: Never    Smokeless tobacco: Former   Haematologist Use: Former    Substances: Nicotine   Substance and Sexual Activity    Alcohol use: Yes     Alcohol/week: 0.0 - 0.8 standard drinks    Drug use: No    Sexual activity: Yes     Partners: Male     Birth control/protection: Pill     Comment: ocp     Family History   Problem Relation Age of Onset    Cancer Maternal Grandmother     Diabetes Maternal Grandmother     Brain Cancer Sister 66    Diabetes Maternal Aunt     Diabetes Paternal Uncle     Breast Cancer Neg Hx     Colon Cancer Neg Hx     Ovarian Cancer Neg Hx         No Known Allergies  Prior to Admission medications    Medication Sig Start Date End Date Taking? Authorizing Provider   Biotin 2.5 MG CAPS Take by mouth   Yes Ar Automatic Reconciliation   vitamin D 25 MCG (1000 UT) CAPS Take by mouth daily   Yes Ar Automatic Reconciliation   cyanocobalamin 100 MCG tablet Take 100 mcg by mouth daily   Yes Ar Automatic Reconciliation    norethindrone-ethinyl estradiol (JUNEL FE 1/20) 1-20 MG-MCG per tablet TAKE 1 TABLET BY MOUTH EVERY DAY 02/18/20  Yes Ar Automatic Reconciliation        Review of Systems:  negative     Objective:     Vitals:    01/18/21 0836   BP: 106/68   Weight: 139 lb (63 kg)   Height: 5\' 7"  (1.702 m)       Physical Exam:  A&Ox3, NAD  CV RRR  Lungs CTAB    Assessment:     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  Discussed the risks of surgery including the risks of bleeding, infection, deep vein thrombosis, and surgical injuries  female reproductive organs which can lead to cervical incompetence (weakening) and difficulties in pregnancy.  Interventions reviewed. Pelvic 4-weeks post op reviewed.. The patient understands the risks; any and all questions were answered to the patient's satisfaction.

## 2021-01-19 NOTE — Progress Notes (Signed)
Pt is scheduled for LEEP on 01-22-21 with Dr Sheran Spine.  Insurance pre-authorization is not required.  Auth/Ref # [id number]. Pt has been notified of dates, times and locations.

## 2021-01-27 ENCOUNTER — Encounter

## 2021-01-29 ENCOUNTER — Encounter

## 2021-01-29 MED ORDER — NORETHIN ACE-ETH ESTRAD-FE 1-20 MG-MCG PO TABS
1-20- MG-MCG | PACK | Freq: Every day | ORAL | 3 refills | Status: DC
Start: 2021-01-29 — End: 2021-06-14

## 2021-02-12 ENCOUNTER — Encounter: Payer: BLUE CROSS/BLUE SHIELD | Attending: Obstetrics & Gynecology | Primary: Family Medicine

## 2021-02-15 ENCOUNTER — Encounter: Payer: BLUE CROSS/BLUE SHIELD | Attending: Obstetrics & Gynecology | Primary: Family Medicine

## 2021-02-16 NOTE — Other (Signed)
Patient verified name and DOB.  Order for consent found in EHR and matches case posting; patient verifies procedure.   Type 1B surgery, phone assessment complete.  Orders received.  Labs per surgeon: none  Labs per anesthesia protocol: Hgb DOS; order signed and held in EHR.     Patient answered medical/surgical history questions at their best of ability. All prior to admission medications documented in [facility].  Patient instructed to take the following medications the day of surgery according to anesthesia guidelines with a small sip of water: Junel. On the day before surgery please take Acetaminophen 1000mg  in the morning and then again before bed. You may substitute for Tylenol 650 mg.   Hold all vitamins 7 days prior to surgery and NSAIDS 5 days prior to surgery.       Patient instructed on the following:    > Arrive at [facility] A Entrance, time of arrival to be called the day before by 1700  > NPO after midnight, unless otherwise indicated, including gum, mints, and ice chips  > Responsible adult must drive patient to the hospital, stay during surgery, and patient will need supervision 24 hours after anesthesia  > Use anti-bacterial in shower the night before surgery and on the morning of surgery  > All piercings must be removed prior to arrival.    > Leave all valuables (money and jewelry) at home but bring insurance card and ID on DOS.   > You may be required to pay a deductible or co-pay on the day of your procedure. You can pre-pay by calling [phone] if your surgery is at the [facility] or [phone] if your surgery is at the [facility].  > Do not wear make-up, nail polish, lotions, cologne, perfumes, powders, or oil on skin. Artificial nails are not permitted.

## 2021-02-19 ENCOUNTER — Inpatient Hospital Stay: Payer: BLUE CROSS/BLUE SHIELD

## 2021-02-19 LAB — POC PREGNANCY UR-QUAL: Preg Test, Ur: NEGATIVE

## 2021-02-19 LAB — HEMOGLOBIN: Hemoglobin: 14 g/dL (ref 11.7–15.4)

## 2021-02-19 MED ORDER — EPHEDRINE SULFATE-NACL 50-0.9 MG/5ML-% IV SOSY
INTRAVENOUS | Status: DC | PRN
Start: 2021-02-19 — End: 2021-02-19
  Administered 2021-02-19: 15:00:00 10 via INTRAVENOUS

## 2021-02-19 MED ORDER — KETOROLAC TROMETHAMINE 30 MG/ML IJ SOLN
30 MG/ML | INTRAMUSCULAR | Status: DC | PRN
Start: 2021-02-19 — End: 2021-02-19
  Administered 2021-02-19: 15:00:00 30 via INTRAVENOUS

## 2021-02-19 MED ORDER — ACETIC ACID 6% TOPICAL SOLUTION
Status: AC
Start: 2021-02-19 — End: ?

## 2021-02-19 MED ORDER — ACETAMINOPHEN 500 MG PO TABS
500 MG | Freq: Once | ORAL | Status: AC
Start: 2021-02-19 — End: 2021-02-19
  Administered 2021-02-19: 13:00:00 1000 mg via ORAL

## 2021-02-19 MED ORDER — FERRIC SUBSULFATE SOLN
Status: DC | PRN
Start: 2021-02-19 — End: 2021-02-19
  Administered 2021-02-19: 15:00:00 30 via TOPICAL

## 2021-02-19 MED ORDER — OXYCODONE HCL 5 MG PO TABS
5 MG | Freq: Once | ORAL | Status: AC | PRN
Start: 2021-02-19 — End: 2021-02-19
  Administered 2021-02-19: 16:00:00 5 mg via ORAL

## 2021-02-19 MED ORDER — MIDAZOLAM HCL (PF) 2 MG/2ML IJ SOLN
2 MG/ML | Freq: Once | INTRAMUSCULAR | Status: AC | PRN
Start: 2021-02-19 — End: 2021-02-19
  Administered 2021-02-19: 14:00:00 2 mg via INTRAVENOUS

## 2021-02-19 MED ORDER — LIDOCAINE HCL (PF) 2 % IJ SOLN
2 % | INTRAMUSCULAR | Status: DC | PRN
Start: 2021-02-19 — End: 2021-02-19
  Administered 2021-02-19: 15:00:00 50 via INTRAVENOUS

## 2021-02-19 MED ORDER — LACTATED RINGERS IV SOLN
INTRAVENOUS | Status: DC
Start: 2021-02-19 — End: 2021-02-19
  Administered 2021-02-19: 14:00:00 via INTRAVENOUS

## 2021-02-19 MED ORDER — PHENYLEPHRINE HCL 10 MG/ML IV SOLN
10 MG/ML | INTRAVENOUS | Status: AC
Start: 2021-02-19 — End: ?

## 2021-02-19 MED ORDER — LIDOCAINE HCL 1 % IJ SOLN
1 % | Freq: Once | INTRAMUSCULAR | Status: AC | PRN
Start: 2021-02-19 — End: 2021-02-19
  Administered 2021-02-19: 14:00:00 1 mL via INTRADERMAL

## 2021-02-19 MED ORDER — IBUPROFEN 800 MG PO TABS
800 MG | ORAL_TABLET | Freq: Three times a day (TID) | ORAL | 0 refills | Status: AC | PRN
Start: 2021-02-19 — End: 2022-04-12

## 2021-02-19 MED ORDER — PROCHLORPERAZINE EDISYLATE 10 MG/2ML IJ SOLN
10 MG/2ML | Freq: Once | INTRAMUSCULAR | Status: AC | PRN
Start: 2021-02-19 — End: 2021-02-19
  Administered 2021-02-19: 16:00:00 5 mg via INTRAVENOUS

## 2021-02-19 MED ORDER — PROPOFOL 200 MG/20ML IV EMUL
20020 MG/20ML | INTRAVENOUS | Status: DC | PRN
Start: 2021-02-19 — End: 2021-02-19
  Administered 2021-02-19: 15:00:00 100 via INTRAVENOUS
  Administered 2021-02-19: 15:00:00 200 via INTRAVENOUS

## 2021-02-19 MED ORDER — FENTANYL CITRATE (PF) 100 MCG/2ML IJ SOLN
1002 MCG/2ML | INTRAMUSCULAR | Status: AC
Start: 2021-02-19 — End: ?

## 2021-02-19 MED ORDER — ACETIC ACID 6% TOPICAL SOLUTION
Status: DC | PRN
Start: 2021-02-19 — End: 2021-02-19
  Administered 2021-02-19: 15:00:00 30 via TOPICAL

## 2021-02-19 MED ORDER — HYDROMORPHONE HCL PF 1 MG/ML IJ SOLN
1 MG/ML | INTRAMUSCULAR | Status: DC | PRN
Start: 2021-02-19 — End: 2021-02-19

## 2021-02-19 MED ORDER — PHENYLEPHRINE HCL 10 MG/ML IV SOLN
10 MG/ML | INTRAVENOUS | Status: DC | PRN
Start: 2021-02-19 — End: 2021-02-19
  Administered 2021-02-19: 15:00:00 5 via SUBCUTANEOUS

## 2021-02-19 MED ORDER — MONSELS FERRIC SUBSULFATE EX SOLN
CUTANEOUS | Status: AC
Start: 2021-02-19 — End: ?

## 2021-02-19 MED ORDER — IODINE STRONG 5 % PO SOLN
5 % | ORAL | Status: AC
Start: 2021-02-19 — End: ?

## 2021-02-19 MED ORDER — FENTANYL CITRATE (PF) 100 MCG/2ML IJ SOLN
1002 MCG/2ML | INTRAMUSCULAR | Status: DC | PRN
Start: 2021-02-19 — End: 2021-02-19
  Administered 2021-02-19: 15:00:00 50 via INTRAVENOUS

## 2021-02-19 MED ORDER — DEXAMETHASONE SODIUM PHOSPHATE 4 MG/ML IJ SOLN
4 MG/ML | INTRAMUSCULAR | Status: DC | PRN
Start: 2021-02-19 — End: 2021-02-19
  Administered 2021-02-19: 15:00:00 4 via INTRAVENOUS

## 2021-02-19 MED ORDER — ONDANSETRON HCL 4 MG/2ML IJ SOLN
4 MG/2ML | INTRAMUSCULAR | Status: DC | PRN
Start: 2021-02-19 — End: 2021-02-19
  Administered 2021-02-19: 15:00:00 4 via INTRAVENOUS

## 2021-02-19 MED FILL — OXYCODONE HCL 5 MG PO TABS: 5 MG | ORAL | Qty: 1

## 2021-02-19 MED FILL — ACETIC ACID 6% TOPICAL SOLUTION: Qty: 30

## 2021-02-19 MED FILL — IODINE STRONG 5 % PO SOLN: 5 % | ORAL | Qty: 14

## 2021-02-19 MED FILL — MONSELS FERRIC SUBSULFATE EX SOLN: CUTANEOUS | Qty: 8

## 2021-02-19 MED FILL — MIDAZOLAM HCL 2 MG/2ML IJ SOLN: 2 MG/ML | INTRAMUSCULAR | Qty: 2

## 2021-02-19 MED FILL — FENTANYL CITRATE (PF) 100 MCG/2ML IJ SOLN: 100 MCG/2ML | INTRAMUSCULAR | Qty: 2

## 2021-02-19 MED FILL — TYLENOL EXTRA STRENGTH 500 MG PO TABS: 500 MG | ORAL | Qty: 2

## 2021-02-19 MED FILL — PROCHLORPERAZINE EDISYLATE 10 MG/2ML IJ SOLN: 10 MG/2ML | INTRAMUSCULAR | Qty: 2

## 2021-02-19 MED FILL — PHENYLEPHRINE HCL (PRESSORS) 10 MG/ML IV SOLN: 10 MG/ML | INTRAVENOUS | Qty: 1

## 2021-02-19 NOTE — Interval H&P Note (Signed)
Update History & Physical    The patient's History and Physical was reviewed with the patient and I examined the patient. There was no change. The surgical site was confirmed by the patient and me.     Plan: The risks, benefits, expected outcome, and alternative to the recommended procedure have been discussed with the patient. Patient understands and wants to proceed with the procedure.     Electronically signed by Bethanie Dicker, MD on 02/19/2021 at 10:16 AM

## 2021-02-19 NOTE — Anesthesia Post-Procedure Evaluation (Signed)
Department of Anesthesiology  Postprocedure Note    Patient: Amanda Perry  MRN: [id number]  Birthdate: Sep 24, 1995  Date of evaluation: 02/19/2021      Procedure Summary     Date: 02/19/21 Room / Location: [facility] MAIN OR 07 / [facility] MAIN OR    Anesthesia Start: 1039 Anesthesia Stop: 1136    Procedure: LEEP (Cervix) Diagnosis:       CIN I (cervical intraepithelial neoplasia I)      (CIN I (cervical intraepithelial neoplasia I) [N87.0])    Surgeons: Tomasa Hosteller, MD Responsible Provider: Dolly Rias, MD    Anesthesia Type: general ASA Status: 1          Anesthesia Type: No value filed.    Aldrete Phase I: Aldrete Score: 9    Aldrete Phase II:        Anesthesia Post Evaluation    Patient location during evaluation: PACU  Patient participation: complete - patient participated  Level of consciousness: awake and alert  Airway patency: patent  Nausea & Vomiting: no nausea and no vomiting  Complications: no  Cardiovascular status: hemodynamically stable  Respiratory status: acceptable  Hydration status: euvolemic  Comments: Blood pressure (!) 112/56, pulse 90, temperature 97.8 ??F (36.6 ??C), temperature source Temporal, resp. rate 16, height 5\' 7"  (1.702 m), weight 144 lb (65.3 kg), SpO2 99 %.      Pt stable for discharge from PACU  Multimodal analgesia pain management approach

## 2021-02-19 NOTE — Anesthesia Pre-Procedure Evaluation (Signed)
Department of Anesthesiology  Preprocedure Note       Name:  Amanda Perry   Age:  25 y.o.  DOB:  11-Sep-1995                                          MRN:  [id number]         Date:  02/19/2021      Surgeon: Moishe Spice):  Tomasa Hosteller, MD    Procedure: Procedure(s):  DILATATION AND CURETTAGE LEEP    Medications prior to admission:   Prior to Admission medications    Medication Sig Start Date End Date Taking? Authorizing Provider   norethindrone-ethinyl estradiol (JUNEL FE 1/20) 1-20 MG-MCG per tablet Take 1 tablet by mouth daily TAKE 1 TABLET BY MOUTH EVERY DAY 01/29/21   Abby Meagher, APRN - CNP   Biotin 2.5 MG CAPS Take by mouth    Ar Automatic Reconciliation   vitamin D 25 MCG (1000 UT) CAPS Take by mouth daily    Ar Automatic Reconciliation   cyanocobalamin 100 MCG tablet Take 100 mcg by mouth daily    Ar Automatic Reconciliation       Current medications:    Current Facility-Administered Medications   Medication Dose Route Frequency Provider Last Rate Last Admin   ??? lidocaine 1 % injection 1 mL  1 mL IntraDERmal Once PRN Nehemiah Settle, MD       ??? lactated ringers infusion   IntraVENous Continuous Nehemiah Settle, MD 100 mL/hr at 02/19/21 0931 New Bag at 02/19/21 0931   ??? midazolam PF (VERSED) injection 2 mg  2 mg IntraVENous Once PRN Nehemiah Settle, MD           Allergies:  No Known Allergies    Problem List:    Patient Active Problem List   Diagnosis Code   ??? Low back pain M54.50   ??? Asthma J45.909   ??? Abscess L02.91   ??? 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       Past Medical History:        Diagnosis Date   ??? Abnormal Pap smear of cervix    ??? Anemia    ??? Asthma     pt denies on 02/16/21-no inhalers   ??? Cervical dysplasia, mild 01/07/2019    ASCUS + HRHPV   ??? Hydradenitis        Past Surgical History:        Procedure Laterality Date   ??? OTHER SURGICAL HISTORY Right     Cyst Removed from Right Leg   ??? WISDOM TOOTH EXTRACTION         Social History:    Social History     Tobacco Use   ???  Smoking status: Never   ??? Smokeless tobacco: Former   Substance Use Topics   ??? Alcohol use: Yes     Alcohol/week: 0.0 - 0.8 standard drinks                                Counseling given: Not Answered      Vital Signs (Current):   Vitals:    02/16/21 1424 02/19/21 0900   BP:  119/84   Pulse:  58   Resp:  18   Temp:  97.8 ??F (36.6 ??C)   TempSrc:  Temporal   SpO2:  98%   Weight: 140 lb (63.5 kg) 144 lb (65.3 kg)   Height: 5\' 7"  (1.702 m)                                               BP Readings from Last 3 Encounters:   02/19/21 119/84   01/18/21 106/68   12/22/20 110/72       NPO Status: Time of last liquid consumption: 2300                        Time of last solid consumption: 2100                        Date of last liquid consumption: 02/18/21                        Date of last solid food consumption: 02/18/21    BMI:   Wt Readings from Last 3 Encounters:   02/19/21 144 lb (65.3 kg)   01/18/21 139 lb (63 kg)   12/22/20 140 lb (63.5 kg)     Body mass index is 22.55 kg/m??.    CBC:   Lab Results   Component Value Date/Time    HGB 14.0 02/19/2021 09:37 AM       CMP: No results found for: NA, K, CL, CO2, BUN, CREATININE, GFRAA, AGRATIO, LABGLOM, GLUCOSE, GLU, PROT, CALCIUM, BILITOT, ALKPHOS, AST, ALT    POC Tests: No results for input(s): POCGLU, POCNA, POCK, POCCL, POCBUN, POCHEMO, POCHCT in the last 72 hours.    Coags: No results found for: PROTIME, INR, APTT    HCG (If Applicable):   Lab Results   Component Value Date    PREGTESTUR Negative 02/19/2021        ABGs: No results found for: PHART, PO2ART, PCO2ART, HCO3ART, BEART, O2SATART     Type & Screen (If Applicable):  No results found for: LABABO, LABRH    Drug/Infectious Status (If Applicable):  No results found for: HIV, HEPCAB    COVID-19 Screening (If Applicable): No results found for: COVID19        Anesthesia Evaluation  Patient summary reviewed and Nursing notes reviewed  Airway: Mallampati: I  TM distance: >3 FB   Neck ROM: full  Mouth opening: > = 3  FB   Dental: normal exam         Pulmonary:Negative Pulmonary ROS breath sounds clear to auscultation                             Cardiovascular:Negative CV ROS  Exercise tolerance: good (>4 METS),           Rhythm: regular  Rate: normal                    Neuro/Psych:   Negative Neuro/Psych ROS              GI/Hepatic/Renal: Neg GI/Hepatic/Renal ROS            Endo/Other: Negative Endo/Other ROS                    Abdominal:             Vascular: negative vascular ROS.  Other Findings:           Anesthesia Plan      general     ASA 1       Induction: intravenous.      Anesthetic plan and risks discussed with patient and spouse.                        Dolly Rias, MD   02/19/2021

## 2021-02-19 NOTE — Discharge Instructions (Signed)
MEDICATION INTERACTION:  During your procedure you potentially received a medication or medications which may reduce the effectiveness of oral contraceptives. Please consider other forms of contraception for 1 month following your procedure if you are currently using oral contraceptives as your primary form of birth control. In addition to this, we recommend continuing your oral contraceptive as prescribed, unless otherwise instructed by your physician, during this time.    After general anesthesia or intravenous sedation, for 24 hours or while taking prescription Narcotics:  Limit your activities  A responsible adult needs to be with you for the next 24 hours  Do not drive and operate hazardous machinery  Do not make important personal or business decisions  Do not drink alcoholic beverages  If you have not urinated within 8 hours after discharge, and you are experiencing discomfort from urinary retention, please go to the nearest ED.  If you have sleep apnea and have a CPAP machine, please use it for all naps and sleeping.  Please use caution when taking narcotics and any of your home medications that may cause drowsiness.  *  Please give a list of your current medications to your Primary Care Provider.  *  Please update this list whenever your medications are discontinued, doses are      changed, or new medications (including over-the-counter products) are added.  *  Please carry medication information at all times in case of emergency situations.    These are general instructions for a healthy lifestyle:  No smoking/ No tobacco products/ Avoid exposure to second hand smoke  Surgeon General's Warning:  Quitting smoking now greatly reduces serious risk to your health.  Obesity, smoking, and sedentary lifestyle greatly increases your risk for illness  A healthy diet, regular physical exercise & weight monitoring are important for maintaining a healthy lifestyle    You may be retaining fluid if you have a history of  heart failure or if you experience any of the following symptoms:  Weight gain of 3 pounds or more overnight or 5 pounds in a week, increased swelling in our hands or feet or shortness of breath while lying flat in bed.  Please call your doctor as soon as you notice any of these symptoms; do not wait until your next office visit.

## 2021-02-19 NOTE — H&P (Signed)
Gynecology History and Physical     Name: Amanda Perry MRN: [id number] SSN: [id number]    Date of Birth: 1995-06-17  Age: 25 y.o.  Sex: female        Subjective:      Chief complaint:  Cervical dysplasia     Amanda Perry is a 25 y.o. Caucasian female with a history of dysplasia on recent colpo showing extensive lesion over entire anterior cervix.  We have followed for nearly 2 yrs.     OB History         Gravida   0    Para   0    Term   0    Preterm   0    AB   0    Living   0           SAB   0    IAB   0    Ectopic   0    Molar   0    Multiple   0    Live Births   0                     Past Medical History:   Diagnosis Date    Abnormal Pap smear of cervix      Anemia      Asthma      Cervical dysplasia, mild 01/07/2019     ASCUS + HRHPV    Hydradenitis              Past Surgical History:   Procedure Laterality Date    OTHER SURGICAL HISTORY Right       Cyst Removed from Right Leg    WISDOM TOOTH EXTRACTION          Social History            Occupational History    Not on file   Tobacco Use    Smoking status: Never    Smokeless tobacco: Former   Haematologist Use: Former    Substances: Nicotine   Substance and Sexual Activity    Alcohol use: Yes       Alcohol/week: 0.0 - 0.8 standard drinks    Drug use: No    Sexual activity: Yes       Partners: Male       Birth control/protection: Pill       Comment: ocp            Family History   Problem Relation Age of Onset    Cancer Maternal Grandmother      Diabetes Maternal Grandmother      Brain Cancer Sister 31    Diabetes Maternal Aunt      Diabetes Paternal Uncle      Breast Cancer Neg Hx      Colon Cancer Neg Hx      Ovarian Cancer Neg Hx           No Known Allergies          Prior to Admission medications    Medication Sig Start Date End Date Taking? Authorizing Provider   Biotin 2.5 MG CAPS Take by mouth     Yes Ar Automatic Reconciliation   vitamin D 25 MCG (1000 UT) CAPS Take by mouth daily     Yes Ar Automatic Reconciliation   cyanocobalamin 100 MCG tablet Take  100 mcg by mouth daily     Yes Ar Automatic Reconciliation  norethindrone-ethinyl estradiol (JUNEL FE 1/20) 1-20 MG-MCG per tablet TAKE 1 TABLET BY MOUTH EVERY DAY 02/18/20   Yes Ar Automatic Reconciliation         Review of Systems:  negative      Objective:          Vitals:     01/18/21 0836   BP: 106/68   Weight: 139 lb (63 kg)   Height: 5\' 7"  (1.702 m)         Physical Exam:  A&Ox3, NAD  CV RRR  Lungs CTAB     Assessment:      Cervical dysplasia with gradual progression of disease     Plan:      Admit for LEEP procedure. Has a h/o severe vaso-vagal reactions so this is being done in hospital under general.  Discussed the risks of surgery including the risks of bleeding, infection, deep vein thrombosis, and surgical injuries  female reproductive organs which can lead to cervical incompetence (weakening) and difficulties in pregnancy.  Interventions reviewed. Pelvic 4-weeks post op reviewed.. The patient understands the risks; any and all questions were answered to the patient's satisfaction.

## 2021-02-19 NOTE — Op Note (Signed)
LEEP EXAM      Date:  February 19, 2021    Name:  Amanda Perry  Date of Birth:  1996/02/01 Age:  25 y.o.    LMP: No LMP recorded.      UCG:  negative    Patient was informed of risks of bleeding / infection / cervical incompetence / pre term labor and delivery, 92-95% cure rate, need for frequent paps for 1-2 years following the procedure.    Biopsy and previous office notes/diagram reviewed.    Patient was placed in lithotomy position.  Speculum inserted and Bovie pad applied.  Acetic acid was Used for colposcopy.  Lesion identified and noted to estend anteriorly from 10:00 - 2:00 with lateral extension as well as a small amount on the posterior cervix. Marland Kitchen     Under colposcopic visualization, 15 mm loop used to remove transformation zone in 1 pass(es).  Additional pass (Top-Hat) performed with the 83mm loop. Ecc above the leep performed..  Settings were 60 coag 40 cut blended.  Base was coagulated with a bovie electrode.  Monsels was applied with good hemostasis.    Patient was given pelvic rest, bleeding and infection precautions.  Return in 1 month.        Bethanie Dicker, MD

## 2021-03-11 ENCOUNTER — Ambulatory Visit
Admit: 2021-03-11 | Discharge: 2021-03-11 | Payer: BLUE CROSS/BLUE SHIELD | Attending: Obstetrics & Gynecology | Primary: Family Medicine

## 2021-03-11 DIAGNOSIS — Z09 Encounter for follow-up examination after completed treatment for conditions other than malignant neoplasm: Secondary | ICD-10-CM

## 2021-03-11 NOTE — Progress Notes (Signed)
Amanda Perry presents for postop visit from  LEEP  about 3 weeks ago.  Doing well postoperatively.just some discharge. No pain.  Fever: no Voiding well: yes.  Bowel movements OK: yes.     Exam: A&OX3, NAD.  Spec exam - cvx healing very well.    Reviewed path - all clear margins    A/P.  Stable Post op condition.  Gradually increase activity. Resumption of sexual activity is not recommended at this time.  Follow up 3 month(s) for pap.  If NL proceed with efforts to conceive.  Has started PNV.Marland Kitchen

## 2021-05-30 NOTE — L&D Delivery Note (Signed)
[name], Amanda Perry [[id number]]      Labor Events    Preterm Labor: No  Antenatal Steroids: None  Cervical Ripening Date/Time:      Antibiotics Received during Labor: No  Rupture Date/Time:  04/10/22 12:20:00   Rupture Type: SROM  Fluid Color: Clear  Fluid Odor: None  Labor Complications: None              Anesthesia    Method: Epidural       Delivery Details      Delivery Date: 04/10/22 Delivery Time: 15:58:14   Delivery Type: Vaginal, Spontaneous              Newborn Presentation    Presentation: Vertex  Position: Left  _: Occiput  _: Anterior       Assisted Delivery Details    Forceps Attempted?: No  Vacuum Extractor Attempted?: No                                                             Cord    Vessels: 3 Vessels  Complications: None  Delayed Cord Clamping?: Yes  Cord Blood Disposition: Lab  Gases Sent?: No              Placenta    Date/Time: 04/10/2022 16:01:00  Removal: Spontaneous  Appearance: Intact  Disposition: Discarded       Lacerations           Vaginal Counts    Initial Count Personnel: TRACIE  Initial Count Verified By: Yancey Flemings Sponge Count: Correct Intial Needles Count: Correct Intial Instruments Count: Correct   Final Count Personnel: TRACIE  Final Count Verified By: Connye Burkitt  Accurate Final Count?: Yes       Blood Loss  Mother: Nikeria, Kalman #[id number]     Start of Mother's Information      Delivery Blood Loss  04/10/22 0358 - 04/10/22 1617      None                 End of Mother's Information  Mother: Lasean, Rahming #[id number]                Delivery Providers    Delivering clinician: Adolphus Birchwood, MD     Provider Role    Sovereign Ramiro, Rhys Martini, MD Obstetrician    Estill Cotta, RN Primary Nurse    Wetzel Bjornstad, RN Primary Newborn Nurse    Cletis Athens, RN Charge Nurse     Neonatologist     Anesthesiologist     Nurse Anesthetist    Leveda Anna Scrub Tech              Newborn Assessment    Living Status: Living        Skin Color:   Heart Rate:   Reflex Irritability:   Muscle Tone:    Respiratory Effort:   Total:            1 Minute:    1    2    2    2    2    9         5  Minute:    1    2    2     2  2    9                                        Apgars Assigned By: HEATON              Resuscitation    Method: Bulb Suction, Room Air, Stimulation             Newborn Measurements      Birth Weight: 3600 g   Birth Length: 53.5 cm     Head Circumference: 34 cm     Chest Circumference: 34 cm              Skin to Skin      Skin to Skin Initiation Date/Time: 04/10/22 16:16:13 EST     Skin to Skin With: Mother                Delivery Note    Obstetrician:  Lora Paula, MD    Assistant: none    Pre-Delivery Diagnosis: Term pregnancy, Spontaneous labor, and Single fetus    Post-Delivery Diagnosis: Living newborn infant(s) and Female    Intrapartum Event: None    Procedure: Spontaneous vaginal delivery    Epidural: Yes    Monitor:  Fetal Heart Tones - External and Uterine Contractions - External    Indications for instrumental delivery: none    Estimated Blood Loss: see qbl    Episiotomy: none    Laceration(s):  vaginal    Laceration(s) repair: Yes    Presentation: Cephalic    Fetal Description: singleton    Fetal Position: Left Occiput Anterior    BABY INFORMATION  NAME:   Information for the patient's newborn:  [name], [name] [[id number]]   Amanda Lucious Groves   SEX: female  DATE AND TIME OF BIRTH:   Information for the patient's newborn:  [name], [name] [[id number]]   04/10/2022  at   Information for the patient's newborn:  [name], [name] [[id number]]   1558   BIRTH MEASUREMENTS:   Information for the patient's newborn:  [name], [name] [[id number]]     and   Information for the patient's newborn:  [name], [name] [[id number]]    .  APGARS:  Information for the patient's newborn:  [name], [name] [[id number]]       Information for the patient's newborn:  [name], [name] [[id number]]        Umbilical Cord: 3 vessels present and Cord blood sent to lab for type, Rh, and Coombs' test    Specimens:  na           Complications:  none           Lab Results   Component Value Date/Time    ABORH O POSITIVE 04/10/2022 05:56 AM            Attending Attestation: I was present and scrubbed for the entire procedure.

## 2021-06-14 ENCOUNTER — Encounter

## 2021-06-14 ENCOUNTER — Ambulatory Visit
Admit: 2021-06-14 | Discharge: 2021-06-14 | Payer: BLUE CROSS/BLUE SHIELD | Attending: Obstetrics & Gynecology | Primary: Family Medicine

## 2021-06-14 DIAGNOSIS — N870 Mild cervical dysplasia: Secondary | ICD-10-CM

## 2021-06-14 NOTE — Progress Notes (Signed)
Amanda Perry is here for repap.  S/p LEEp 01/2022 path reviewed showing LGSIL (CIN I) with complete excision. No c/o. Patient's last menstrual period was 06/03/2021. Just stopped contraception and started PNV.   06/03/2021   Pelvic: NL EGBUS, vagina and cervix.  Pap done.  Kanda was seen today for follow-up.    Diagnoses and all orders for this visit:    CIN I (cervical intraepithelial neoplasia I)  -     PAP IG, Liquid-Based Rfx Aptima HPV when ASC-U, ASC-H, LSIL, HSIL, AGUS; Future     Potential risks of previous LEEP in pregnancy, including serial CL measurements and possible interventions discussed.    TO 22 minutes  No follow-ups on file.

## 2021-06-17 LAB — PAP IG, LIQUID-BASED RFX APTIMA HPV WHEN ASC-U, ASC-H, LSIL, HSIL, AGUS

## 2021-09-16 LAB — HEPATITIS B, EXTERNAL RESULT: Hep B, External Result: NONREACTIVE

## 2021-09-16 LAB — RUBELLA TITER, EXTERNAL RESULT: Rubella Titer, External Result: IMMUNE

## 2021-09-16 LAB — RPR, EXTERNAL RESULT: RPR, External Result: NONREACTIVE

## 2021-09-16 LAB — HIV, EXTERNAL RESULT: HIV, External Result: NONREACTIVE

## 2021-09-16 LAB — HEPATITIS C ANTIBODY, EXTERNAL RESULT: Hepatitis C Antibody, External Result: NEGATIVE

## 2022-03-16 LAB — GBS, EXTERNAL RESULT: GBS, External Result: NOT DETECTED

## 2022-04-06 ENCOUNTER — Encounter: Payer: BLUE CROSS/BLUE SHIELD | Primary: Family Medicine

## 2022-04-07 ENCOUNTER — Encounter: Payer: BLUE CROSS/BLUE SHIELD | Primary: Family Medicine

## 2022-04-10 ENCOUNTER — Inpatient Hospital Stay
Admit: 2022-04-10 | Discharge: 2022-04-12 | Disposition: A | Discharge status: Home / Self Care | Payer: BLUE CROSS/BLUE SHIELD | Attending: Obstetrics & Gynecology | Admitting: Obstetrics & Gynecology

## 2022-04-10 ENCOUNTER — Inpatient Hospital Stay: Admit: 2022-04-10 | Payer: BLUE CROSS/BLUE SHIELD | Primary: Family Medicine

## 2022-04-10 LAB — CBC WITH AUTO DIFFERENTIAL
Basophils %: 0 % (ref 0.0–2.0)
Basophils Absolute: 0 10*3/uL (ref 0.0–0.2)
Eosinophils %: 1 % (ref 0.5–7.8)
Eosinophils Absolute: 0.1 10*3/uL (ref 0.0–0.8)
Hematocrit: 37.5 % (ref 35.8–46.3)
Hemoglobin: 12.7 g/dL (ref 11.7–15.4)
Immature Granulocytes Absolute: 0.1 10*3/uL (ref 0.0–0.5)
Immature Granulocytes: 1 % (ref 0.0–5.0)
Lymphocytes %: 15 % (ref 13–44)
Lymphocytes Absolute: 1.8 10*3/uL (ref 0.5–4.6)
MCH: 29.4 PG (ref 26.1–32.9)
MCHC: 33.9 g/dL (ref 31.4–35.0)
MCV: 86.8 FL (ref 82.0–102.0)
MPV: 9.7 FL (ref 9.4–12.3)
Monocytes %: 6 % (ref 4.0–12.0)
Monocytes Absolute: 0.6 10*3/uL (ref 0.1–1.3)
Neutrophils %: 78 % (ref 43–78)
Neutrophils Absolute: 9 10*3/uL — ABNORMAL HIGH (ref 1.7–8.2)
Platelets: 302 10*3/uL (ref 150–450)
RBC: 4.32 M/uL (ref 4.05–5.2)
RDW: 12.6 % (ref 11.9–14.6)
WBC: 11.6 10*3/uL — ABNORMAL HIGH (ref 4.3–11.1)
nRBC: 0 10*3/uL (ref 0.0–0.2)

## 2022-04-10 LAB — TYPE AND SCREEN
ABO/Rh: O POS
Antibody Screen: NEGATIVE

## 2022-04-10 MED ORDER — NORMAL SALINE FLUSH 0.9 % IV SOLN
0.9 % | Freq: Two times a day (BID) | INTRAVENOUS | Status: DC
Start: 2022-04-10 — End: 2022-04-12

## 2022-04-10 MED ORDER — MINERAL OIL PO OIL
Freq: Every day | ORAL | Status: DC | PRN
Start: 2022-04-10 — End: 2022-04-10

## 2022-04-10 MED ORDER — ONDANSETRON HCL 4 MG/2ML IJ SOLN
4 MG/2ML | Freq: Four times a day (QID) | INTRAMUSCULAR | Status: DC | PRN
Start: 2022-04-10 — End: 2022-04-10

## 2022-04-10 MED ORDER — BENZOCAINE-MENTHOL 20-0.5 % EX AERO
CUTANEOUS | Status: AC | PRN
Start: 2022-04-10 — End: 2022-04-12
  Administered 2022-04-11: 01:00:00 via TOPICAL

## 2022-04-10 MED ORDER — POLYETHYLENE GLYCOL 3350 17 G PO PACK
17 g | Freq: Every day | ORAL | Status: DC
Start: 2022-04-10 — End: 2022-04-12

## 2022-04-10 MED ORDER — IBUPROFEN 800 MG PO TABS
800 MG | Freq: Three times a day (TID) | ORAL | Status: AC
Start: 2022-04-10 — End: 2022-04-12
  Administered 2022-04-11 – 2022-04-12 (×5): 800 mg via ORAL

## 2022-04-10 MED ORDER — ROPIVACAINE HCL 2 MG/ML IJ SOLN
INTRAMUSCULAR | Status: DC | PRN
Start: 2022-04-10 — End: 2022-04-10
  Administered 2022-04-10: 13:00:00 8 via EPIDURAL
  Administered 2022-04-10: 13:00:00 10 via EPIDURAL

## 2022-04-10 MED ORDER — MEDELA TENDER CARE LANOLIN EX CREA
CUTANEOUS | Status: DC | PRN
Start: 2022-04-10 — End: 2022-04-12

## 2022-04-10 MED ORDER — NORMAL SALINE FLUSH 0.9 % IV SOLN
0.9 % | INTRAVENOUS | Status: DC | PRN
Start: 2022-04-10 — End: 2022-04-12

## 2022-04-10 MED ORDER — FAMOTIDINE 20 MG PO TABS
20 MG | Freq: Once | ORAL | Status: DC
Start: 2022-04-10 — End: 2022-04-10

## 2022-04-10 MED ORDER — LIDOCAINE HCL 1 % IJ SOLN
1 % | Freq: Once | INTRAMUSCULAR | Status: DC | PRN
Start: 2022-04-10 — End: 2022-04-10

## 2022-04-10 MED ORDER — OXYCODONE HCL 5 MG PO TABS
5 MG | ORAL | Status: AC | PRN
Start: 2022-04-10 — End: 2022-04-12

## 2022-04-10 MED ORDER — NORMAL SALINE FLUSH 0.9 % IV SOLN
0.9 % | Freq: Two times a day (BID) | INTRAVENOUS | Status: DC
Start: 2022-04-10 — End: 2022-04-10

## 2022-04-10 MED ORDER — RHO D IMMUNE GLOBULIN 1500 UNITS IM SOSY
1500 units | Freq: Once | INTRAMUSCULAR | Status: AC
Start: 2022-04-10 — End: 2022-04-12

## 2022-04-10 MED ORDER — NORMAL SALINE FLUSH 0.9 % IV SOLN
0.9 % | INTRAVENOUS | Status: DC | PRN
Start: 2022-04-10 — End: 2022-04-10

## 2022-04-10 MED ORDER — DOCUSATE SODIUM 100 MG PO CAPS
100 MG | Freq: Two times a day (BID) | ORAL | Status: AC | PRN
Start: 2022-04-10 — End: 2022-04-12
  Administered 2022-04-11: 01:00:00 100 mg via ORAL

## 2022-04-10 MED ORDER — ACETAMINOPHEN 500 MG PO TABS
500 MG | Freq: Four times a day (QID) | ORAL | Status: DC
Start: 2022-04-10 — End: 2022-04-12
  Administered 2022-04-11 – 2022-04-12 (×2): 1000 mg via ORAL

## 2022-04-10 MED ORDER — ACETAMINOPHEN 325 MG PO TABS
325 MG | ORAL | Status: DC | PRN
Start: 2022-04-10 — End: 2022-04-10

## 2022-04-10 MED ORDER — ONDANSETRON HCL 4 MG/2ML IJ SOLN
42 MG/2ML | Freq: Four times a day (QID) | INTRAMUSCULAR | Status: DC | PRN
Start: 2022-04-10 — End: 2022-04-12

## 2022-04-10 MED ORDER — TETANUS-DIPHTH-ACELL PERTUSSIS 5-2.5-18.5 LF-MCG/0.5 IM SUSP
5-2.5-18.5-0.5 LF-MCG/0.5 | INTRAMUSCULAR | Status: DC
Start: 2022-04-10 — End: 2022-04-12

## 2022-04-10 MED ORDER — ONDANSETRON 8 MG PO TBDP
8 MG | Freq: Three times a day (TID) | ORAL | Status: AC | PRN
Start: 2022-04-10 — End: 2022-04-12

## 2022-04-10 MED ORDER — LACTATED RINGERS IV SOLN
INTRAVENOUS | Status: DC
Start: 2022-04-10 — End: 2022-04-10
  Administered 2022-04-10: 14:00:00 via INTRAVENOUS

## 2022-04-10 MED ORDER — SODIUM CHLORIDE 0.9 % IV SOLN
0.9 % | INTRAVENOUS | Status: DC | PRN
Start: 2022-04-10 — End: 2022-04-10

## 2022-04-10 MED ORDER — WITCH HAZEL-GLYCERIN EX PADS
CUTANEOUS | Status: DC | PRN
Start: 2022-04-10 — End: 2022-04-12
  Administered 2022-04-11: 01:00:00 via TOPICAL

## 2022-04-10 MED ORDER — OXYTOCIN 30 UNITS IN 500 ML INFUSION
30 UNIT/500ML | INTRAVENOUS | Status: DC
Start: 2022-04-10 — End: 2022-04-10
  Administered 2022-04-10: 14:00:00 2 m[IU]/min via INTRAVENOUS

## 2022-04-10 MED ORDER — SODIUM CHLORIDE 0.9 % IV SOLN
0.9 % | INTRAVENOUS | Status: DC | PRN
Start: 2022-04-10 — End: 2022-04-12

## 2022-04-10 MED ORDER — FAMOTIDINE 20 MG PO TABS
20 MG | Freq: Two times a day (BID) | ORAL | Status: DC
Start: 2022-04-10 — End: 2022-04-12

## 2022-04-10 MED ORDER — SIMETHICONE 80 MG PO CHEW
80 MG | Freq: Four times a day (QID) | ORAL | Status: DC | PRN
Start: 2022-04-10 — End: 2022-04-12

## 2022-04-10 MED ORDER — OXYCODONE HCL 5 MG PO TABS
5 MG | ORAL | Status: DC | PRN
Start: 2022-04-10 — End: 2022-04-12

## 2022-04-10 MED ORDER — LACTATED RINGERS IV BOLUS
INTRAVENOUS | Status: DC | PRN
Start: 2022-04-10 — End: 2022-04-10

## 2022-04-10 MED ORDER — LACTATED RINGERS IV SOLN
INTRAVENOUS | Status: DC
Start: 2022-04-10 — End: 2022-04-12

## 2022-04-10 MED FILL — OXYTOCIN 30 UNITS IN 500 ML INFUSION: 30 UNIT/500ML | INTRAVENOUS | Qty: 500

## 2022-04-10 MED FILL — MINERAL OIL HEAVY PO OIL: ORAL | Qty: 240

## 2022-04-10 NOTE — Anesthesia Pre-Procedure Evaluation (Signed)
Department of Anesthesiology  Preprocedure Note       Name:  Amanda Perry   Age:  26 y.o.  DOB:  [birth year]                                          MRN:  [id number]         Date:  04/10/2022      Surgeon: * No surgeons listed *    Procedure: * No procedures listed *    Medications prior to admission:   Prior to Admission medications    Medication Sig Start Date End Date Taking? Authorizing Provider   ibuprofen (ADVIL;MOTRIN) 800 MG tablet Take 1 tablet by mouth every 8 hours as needed for Pain  Patient not taking: Reported on 04/10/2022 02/19/21   Doristine Johns, MD   Biotin 2.5 MG CAPS Take by mouth  Patient not taking: Reported on 04/10/2022    Automatic Reconciliation, Ar   vitamin D 25 MCG (1000 UT) CAPS Take by mouth daily  Patient not taking: Reported on 04/10/2022    Automatic Reconciliation, Ar   cyanocobalamin 100 MCG tablet Take 100 mcg by mouth daily  Patient not taking: Reported on 04/10/2022    Automatic Reconciliation, Ar       Current medications:    Current Facility-Administered Medications   Medication Dose Route Frequency Provider Last Rate Last Admin   . lactated ringers IV soln infusion   IntraVENous Continuous Margaret Pyle, MD       . lactated ringers bolus bolus 500 mL  500 mL IntraVENous PRN Margaret Pyle, MD        Or   . lactated ringers bolus bolus 1,000 mL  1,000 mL IntraVENous PRN Margaret Pyle, MD       . sodium chloride flush 0.9 % injection 5-40 mL  5-40 mL IntraVENous 2 times per day Margaret Pyle, MD       . sodium chloride flush 0.9 % injection 5-40 mL  5-40 mL IntraVENous PRN Margaret Pyle, MD       . 0.9 % sodium chloride infusion  25 mL IntraVENous PRN Margaret Pyle, MD       . ondansetron [address] [facility]) injection 4 mg  4 mg IntraVENous Q6H PRN Margaret Pyle, MD       . acetaminophen (TYLENOL) tablet 650 mg  650 mg Oral Q4H PRN Margaret Pyle, MD       . oxytocin (PITOCIN) 30 units in 500 mL infusion  1-20 milli-units/min IntraVENous Continuous Margaret Pyle, MD       .  lidocaine 1 % injection 1 mL  1 mL IntraDERmal Once PRN Judeth Cornfield, MD       . famotidine (PEPCID) tablet 20 mg  20 mg Oral Once Judeth Cornfield, MD       . sodium chloride flush 0.9 % injection 5-40 mL  5-40 mL IntraVENous 2 times per day Judeth Cornfield, MD       . sodium chloride flush 0.9 % injection 5-40 mL  5-40 mL IntraVENous PRN Judeth Cornfield, MD       . 0.9 % sodium chloride infusion   IntraVENous PRN Judeth Cornfield, MD           Allergies:  No Known Allergies    Problem List:  Patient Active Problem List   Diagnosis Code   . Low back pain M54.50   . Asthma J45.909   . Abscess L02.91   . 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 Normal labor O80, Z37.9   . Abdominal pain affecting pregnancy O26.899, R10.9       Past Medical History:        Diagnosis Date   . Abnormal Pap smear of cervix    . Anemia    . Asthma     pt denies on 02/16/21-no inhalers   . Cervical dysplasia, mild 01/07/2019    ASCUS + HRHPV   . Hydradenitis        Past Surgical History:        Procedure Laterality Date   . LEEP N/A 02/19/2021    LEEP performed by Tomasa Hosteller, MD at [facility] MAIN OR   . OTHER SURGICAL HISTORY Right     Cyst Removed from Right Leg   . WISDOM TOOTH EXTRACTION         Social History:    Social History     Tobacco Use   . Smoking status: Never   . Smokeless tobacco: Former   Substance Use Topics   . Alcohol use: Yes     Alcohol/week: 0.0 - 0.8 standard drinks of alcohol                                Counseling given: Not Answered      Vital Signs (Current):   Vitals:    04/10/22 0403 04/10/22 0404   BP: 131/84    Pulse: 90    Resp: 16    Temp: 97.8 F (36.6 C)    TempSrc: Oral    SpO2: 98%    Weight:  85.3 kg (188 lb)   Height:  1.702 m (5\' 7" )                                              BP Readings from Last 3 Encounters:   04/10/22 131/84   06/14/21 102/64   03/11/21 118/70       NPO Status:                                                                                 BMI:   Wt  Readings from Last 3 Encounters:   04/10/22 85.3 kg (188 lb)   06/14/21 67.1 kg (148 lb)   03/11/21 65.3 kg (144 lb)     Body mass index is 29.44 kg/m.    CBC:   Lab Results   Component Value Date/Time    WBC 11.6 04/10/2022 05:56 AM    RBC 4.32 04/10/2022 05:56 AM    HGB 12.7 04/10/2022 05:56 AM    HCT 37.5 04/10/2022 05:56 AM    MCV 86.8 04/10/2022 05:56 AM    RDW 12.6 04/10/2022 05:56 AM    PLT 302 04/10/2022 05:56 AM  CMP: No results found for: "NA", "K", "CL", "CO2", "BUN", "CREATININE", "GFRAA", "AGRATIO", "LABGLOM", "GLUCOSE", "GLU", "PROT", "CALCIUM", "BILITOT", "ALKPHOS", "AST", "ALT"    POC Tests: No results for input(s): "POCGLU", "POCNA", "POCK", "POCCL", "POCBUN", "POCHEMO", "POCHCT" in the last 72 hours.    Coags: No results found for: "PROTIME", "INR", "APTT"    HCG (If Applicable):   Lab Results   Component Value Date    PREGTESTUR Negative 02/19/2021        ABGs: No results found for: "PHART", "PO2ART", "PCO2ART", "HCO3ART", "BEART", "O2SATART"     Type & Screen (If Applicable):  No results found for: "LABABO", "LABRH"    Drug/Infectious Status (If Applicable):  Lab Results   Component Value Date/Time    HEPCAB <0.1 06/14/2019 08:09 AM       COVID-19 Screening (If Applicable): No results found for: "COVID19"        Anesthesia Evaluation  Patient summary reviewed and Nursing notes reviewed no history of anesthetic complications:   Airway: Mallampati: II  TM distance: >3 FB   Neck ROM: full  Mouth opening: > = 3 FB   Dental: normal exam         Pulmonary:   (+) asthma:                            Cardiovascular:Negative CV ROS  Exercise tolerance: good (>4 METS),                     Neuro/Psych:   Negative Neuro/Psych ROS              GI/Hepatic/Renal: Neg GI/Hepatic/Renal ROS            Endo/Other: Negative Endo/Other ROS                    Abdominal:             Vascular: negative vascular ROS.         Other Findings:           Anesthesia Plan      epidural     ASA 2             Anesthetic plan  and risks discussed with patient.                        Dolly Rias, MD   04/10/2022

## 2022-04-10 NOTE — Anesthesia Procedure Notes (Signed)
Epidural Block    Patient location during procedure: OB  Start time: 04/10/2022 8:11 AM  End time: 04/10/2022 8:19 AM  Reason for block: labor epidural  Staffing  Performed: anesthesiologist   Anesthesiologist: Judeth Cornfield, MD  Performed by: Judeth Cornfield, MD  Authorized by: Judeth Cornfield, MD    Epidural  Patient position: sitting  Prep: ChloraPrep and site prepped and draped  Patient monitoring: continuous pulse ox and frequent blood pressure checks  Approach: midline  Location: L3-4  Injection technique: LOR saline  Provider prep: mask and sterile gloves  Needle  Needle type: Tuohy   Needle gauge: 17 G  Needle length: 3.5 in  Needle insertion depth: 6 cm  Catheter type: end hole  Catheter size: 19 G  Catheter at skin depth: 12 cm  Test dose: negativeCatheter Secured: tegaderm and tape  Assessment  Hemodynamics: stable  Attempts: 1  Outcomes: patient tolerated procedure well  Additional Notes  Test dose with Lido 1.5% with epi - 3.39ml  Preanesthetic Checklist  Completed: patient identified, IV checked, risks and benefits discussed, equipment checked, pre-op evaluation, timeout performed, anesthesia consent given, oxygen available and monitors applied/VS acknowledged

## 2022-04-10 NOTE — Progress Notes (Signed)
Report given to [employer]. Bracelets checked on mom and baby and matched. cAre relinquished.

## 2022-04-10 NOTE — Progress Notes (Signed)
Delivery of intact placenta, 30 units pitocin infusing at bolus rate as ordered. Repair in process.

## 2022-04-10 NOTE — Progress Notes (Signed)
Pt to OBED with report of worsening contractions. EFM applied.

## 2022-04-10 NOTE — Progress Notes (Signed)
SVD of viable baby girl by Dr Jarold Song

## 2022-04-10 NOTE — Progress Notes (Signed)
Pt taken to [facility]U 454 via W/C holding baby. Waiting on night shift assignments to give report.

## 2022-04-10 NOTE — H&P (Signed)
See triage note/admission H&P.    Discussed mgt with Dr. Jarold Song and she agrees with admission/epidural and low dose pitocin to augment past latent stage of labor.

## 2022-04-10 NOTE — Progress Notes (Signed)
SVE: complete, 0-+1  Toco: q 5 min  FHTs: 130s, R  Will start pushing.

## 2022-04-10 NOTE — Progress Notes (Signed)
Perineum cleaned pt legs down from stirrups peripad applied to pt bottom. Baby placed skin to skin with mom

## 2022-04-10 NOTE — Progress Notes (Addendum)
Pushing with u.c.

## 2022-04-10 NOTE — ED Triage Notes (Signed)
History & Physical    Name: Amanda Perry MRN: [id number]  SSN: [id number]    Date of Birth: 1995/10/17  Age: 26 y.o.  Sex: female      Subjective:     Reason for Triage visit:  [redacted]w[redacted]d and painful regular contractions.     History of Present Illness: Amanda Perry is a 26 y.o. Caucasian female with an estimated gestational age of [redacted]w[redacted]d with Estimated Date of Delivery: 04/07/22. Patient states that she has had painful contractions since 0100.  Patient denies vaginal bleeding .    OB History   Gravida Para Term Preterm AB Living   1 0 0 0 0 0   SAB IAB Ectopic Molar Multiple Live Births   0 0 0 0 0 0      # Outcome Date GA Lbr Len/2nd Weight Sex Delivery Anes PTL Lv   1 Current              Past Medical History:   Diagnosis Date    Abnormal Pap smear of cervix     Anemia     Asthma     pt denies on 02/16/21-no inhalers    Cervical dysplasia, mild 01/07/2019    ASCUS + HRHPV    Hydradenitis      Past Surgical History:   Procedure Laterality Date    LEEP N/A 02/19/2021    LEEP performed by Tomasa Hosteller, MD at [facility] MAIN OR    OTHER SURGICAL HISTORY Right     Cyst Removed from Right Leg    WISDOM TOOTH EXTRACTION       Social History     Occupational History    Not on file   Tobacco Use    Smoking status: Never    Smokeless tobacco: Former   Haematologist Use: Former    Substances: Nicotine   Substance and Sexual Activity    Alcohol use: Yes     Alcohol/week: 0.0 - 0.8 standard drinks of alcohol    Drug use: No    Sexual activity: Yes     Partners: Male     Birth control/protection: Pill     Comment: ocp      Family History   Problem Relation Age of Onset    Cancer Maternal Grandmother     Diabetes Maternal Grandmother     Brain Cancer Sister 26    Diabetes Maternal Aunt     Diabetes Paternal Uncle     Breast Cancer Neg Hx     Colon Cancer Neg Hx     Ovarian Cancer Neg Hx        No Known Allergies  Prior to Admission medications    Medication Sig Start Date End Date Taking? Authorizing Provider   ibuprofen (ADVIL;MOTRIN)  800 MG tablet Take 1 tablet by mouth every 8 hours as needed for Pain  Patient not taking: Reported on 04/10/2022 02/19/21   Tomasa Hosteller, MD   Biotin 2.5 MG CAPS Take by mouth  Patient not taking: Reported on 04/10/2022    Automatic Reconciliation, Ar   vitamin D 25 MCG (1000 UT) CAPS Take by mouth daily  Patient not taking: Reported on 04/10/2022    Automatic Reconciliation, Ar   cyanocobalamin 100 MCG tablet Take 100 mcg by mouth daily  Patient not taking: Reported on 04/10/2022    Automatic Reconciliation, Ar        Review of Systems:  Complete review of systems performed.  Those not specifically mentioned in the HPI are either negative are non related to this patient encounter.    Objective:     Vitals:    Vitals:    04/10/22 0403 04/10/22 0404   BP: 131/84    Pulse: 90    Resp: 16    Temp: 97.8 F (36.6 C)    TempSrc: Oral    SpO2: 98%    Weight:  85.3 kg (188 lb)   Height:  1.702 m (5\' 7" )      Temp (24hrs), Avg:97.8 F (36.6 C), Min:97.8 F (36.6 C), Max:97.8 F (36.6 C)    BP  Min: 131/84  Max: 131/84       Physical Exam:  Heart: RRR  Lungs: cta bl  Adb: soft, nt nd, gravid  Ext: no edema noted  CVX: 3/90/-3 posterior.  Membranes:  Intact  Uterine Activity:  Frequency: Every 3-4 minutes.   Fetal Heart Rate:  Baseline: 130. Cat 1     Lab/Data Review:  No results found for this or any previous visit (from the past 24 hour(s)).    Assessment and Plan:   [redacted]w[redacted]d with initial complaints of painful uterine contractions. Of note she is scheduled for IOL tonight and wants admission. GBS status is negative. Pt has had cervical change from 1 cm earlier this week to 3 cm.

## 2022-04-10 NOTE — Anesthesia Post-Procedure Evaluation (Signed)
Department of Anesthesiology  Postprocedure Note    Patient: Amanda Perry  MRN: [id number]  Birthdate: 01-01-1996  Date of evaluation: 04/10/2022      Procedure Summary     Date: 04/10/22 Room / Location:     Anesthesia Start: 0806 Anesthesia Stop: 0093    Procedure: Labor Analgesia Diagnosis:     Scheduled Providers:  Responsible Provider: Judeth Cornfield, MD    Anesthesia Type: epidural ASA Status: 2          Anesthesia Type: No value filed.    Aldrete Phase I:      Aldrete Phase II:        Anesthesia Post Evaluation    Patient location during evaluation: bedside  Patient participation: complete - patient participated  Level of consciousness: awake and alert  Airway patency: patent  Nausea & Vomiting: no nausea and no vomiting  Complications: no  Cardiovascular status: hemodynamically stable  Respiratory status: acceptable  Hydration status: euvolemic  Comments: Blood pressure (!) 117/56, pulse (!) 108, temperature 98.7 F (37.1 C), temperature source Oral, resp. rate 16, height 1.702 m (5\' 7" ), weight 85.3 kg (188 lb), last menstrual period 07/01/2021, SpO2 95 %, unknown if currently breastfeeding.    Pt satisfied with epidural analgesia. No residual neuro deficits.  Multimodal analgesia pain management approach  Pain management: adequate

## 2022-04-10 NOTE — Progress Notes (Signed)
EPIDURAL PLACEMENT      Dr Domingo Cocking at bedside at [phone].  CRNA Elta Guadeloupe at bedside at [place] pt to sitting up on bedside at 0811.    Timeout completed at [phone] with MD, CRNA and myself at bedside.    Test dose given at 0817.  Negative reaction.    Dose given at 0822.    Pt assisted to lying back in left tilt position. Fht 138    See anesthesia record for details.  See vital sign flow sheet for BP.    Tolerated procedure well.

## 2022-04-10 NOTE — Progress Notes (Signed)
Dr Jarold Song called to room for delivery.

## 2022-04-10 NOTE — Progress Notes (Signed)
Dr. Jarold Song called and report given

## 2022-04-10 NOTE — Progress Notes (Signed)
Admission assessment complete as noted. Patient oriented to room and unit. Plan of care reviewed and patient verbalizes understanding. Questions encouraged and answered. Patent encouraged to call for needs or concerns.   Safety Teaching reviewed:   Hand hygiene prior to handling the infant.  Use of bulb syringe.  Bracelets with matching numbers are placed on mother and infant  An infant security tag  (Hugs) is placed on the infant's ankle and monitored  All OB nurses wear pink Employee badges - do not give your baby to anyone without proper identification.   Never leave the baby alone in the room.  The infant should be placed on their back to sleep.on a firm mattress. No toys should be placed in the crib. (safe sleep video offered to view)  Never shake the baby (video offered to view)  Infant fall prevention - do not sleep with the baby, and place the baby in the crib while ambulating.   Mother and Baby Care booklet given to Mother.

## 2022-04-11 ENCOUNTER — Encounter: Payer: BLUE CROSS/BLUE SHIELD | Primary: Family Medicine

## 2022-04-11 LAB — RUBELLA ANTIBODY, IGG: Rubella Antibody IgG: >500 IU/ML

## 2022-04-11 MED FILL — WITCH HAZEL-GLYCERIN EX PADS: CUTANEOUS | Qty: 40

## 2022-04-11 MED FILL — DOCUSATE SODIUM 100 MG PO CAPS: 100 MG | ORAL | Qty: 1

## 2022-04-11 MED FILL — IBUPROFEN 800 MG PO TABS: 800 MG | ORAL | Qty: 1

## 2022-04-11 MED FILL — DERMOPLAST 20-0.5 % EX AERO: CUTANEOUS | Qty: 156

## 2022-04-11 MED FILL — TYLENOL EXTRA STRENGTH 500 MG PO TABS: 500 MG | ORAL | Qty: 2

## 2022-04-11 NOTE — Progress Notes (Signed)
Call placed to [place] to inquire about GC &Chlamydia results from pregnancy, no answer. Left a message that the above test results are needed and to fax results to hospital.

## 2022-04-11 NOTE — Progress Notes (Signed)
Shift assessment complete see flowsheet. Discussed today plan of care with pt. Bleeding precautions given. Pt requesting to go home today. Dr. Merrie Roof notified via phone of pt wanting d/c. Questions encouraged and answered. Pt to call with needs/concerns. Pt in bed with call light in reach. No s/s of distress noted at this time.

## 2022-04-11 NOTE — Care Coordination-Inpatient (Signed)
Discussed and provided patient with  informational packet on perinatal mood and anxiety disorders (resources/education).    Social Worker provided education on [facility] Newborn Home Visit and provided brochure.  Patient declined referral at this time but will consider and call us if she changes her mind.

## 2022-04-11 NOTE — Progress Notes (Signed)
Post Partum day 1   Amanda Perry is a 26 y.o. female,   G1P1    S// Pt reports doing well.  Voiding spontaneously, ambulating independently, lochia diminishing, and pain controlled.  No mood concerns.  Breast feeding.    Vitals:    04/10/22 1753 04/10/22 1809 04/10/22 1912 04/10/22 2307   BP: (!) 97/55 (!) 117/56 120/68 114/78   Pulse: (!) 121 (!) 108 98 64   Resp:   20 16   Temp:   98.1 F (36.7 C) 98.2 F (36.8 C)   TempSrc:   Oral    SpO2:   95% 96%   Weight:       Height:         Gen- NAD  Lungs- respirations even and un labored  CVS- regular rate, no pallor  Fundus- Firm, appropriately tender (RN exam from prior, not reexamined)  Lochia- Normal , per pt    Lab Results   Component Value Date    WBC 11.6 (H) 04/10/2022    HGB 12.7 04/10/2022    HCT 37.5 04/10/2022    MCV 86.8 04/10/2022    PLT 302 04/10/2022       A/P: 26 y.o. year old G1P1001 on PPD#1 s/p TSVD meeting routine milestones.    Plan- routine PP care    Doree Barthel, MD

## 2022-04-11 NOTE — Lactation Note (Signed)
This note was copied from a baby's chart.  Upon entering room mother breastfeeding baby via cross cradle on left breast. This RN noted that baby did not have a deep latch and that baby was just on the  nipple. When mother was asked about feeding she states that baby has been latched mostly just to the nipple with each feeding.  This RN repositioned baby and turned  baby so that baby stomach was flat against mother. This RN assisted mother with getting baby re-latched and this RN was able to get baby latched on deeper on the left breast. Mother immediately states that the latch feels better. Educated and pointed out to mother how baby lips should be flanged out when feeding.  Mother voiced understanding. Questions encouraged and answered. Lanolin provided

## 2022-04-11 NOTE — Lactation Note (Signed)
This note was copied from a baby's chart.  First visit with breastfeeding mom.  Discussed feeding baby on cue.  Opening mouth, turning head from side to side, bringing hands to mouth and sucking movements are all early hunger cues.  Crying is a late hunger cue.  Do lots of skin to skin to help recognize early feeding cues.  If baby does not nurse, continue skin to skin and offer again later.  On average newborns eat at least 8 or more times per day without restriction. Cluster feeding is normal.  Reviewed positioning techniques and signs of a good latch.  Discussed holding baby so that ear, shoulder and hip are in a straight line.  Baby is held close and nose lines up with mom's nipple.  Discussed supporting baby's neck and mom's breast.  When baby opens wide bring baby quickly onto the breast.  Try for an assymetrical latch with nipple pointed towards the roof of baby's mouth.  Mouth should be wide and lips flanged around the breast.  Encouraged to nurse on the first breast until baby finishes that side.  If baby comes off the breast quickly, you can re-offer that same side to ensure good stimulation before moving baby to next side.  Offer the second breast, baby can take the second breast as long as desired.  It is ok if they do not take the second side when offered.  Listen and look for swallows.  Once milk is in over the next few days, swallowing will become more frequent and obvious.  If baby pausing on the breast longer than 10 seconds, remind baby to nurse.  Attempt to burp between breasts and after feeding.  Rotate which breast the baby starts on.  Discussed expected output based on age.  Discussed feed on demand.  If necessary rouse for feedings at least until above birth weight.  Reviewed Breastfeeding Packet and encouraged mom to write down feedings and output at least until first Ped visit.  In accordance with the 2012 AAP Policy Statement on Breastfeeding, Mothers of healthy term breastfed infants  should be delay pacifier use until breastfeeding is well-established, usually about 3 to 4 wk after birth.  Pacifiers are not available on the Mother Infant Unit.  Artificial nipples should also be avoided until breastfeeding is well established.

## 2022-04-11 NOTE — Lactation Note (Signed)
This note was copied from a baby's chart.  In to see mom and infant for the first time. Mom stated that infant latched and nursed after delivery and has been latching and nursing since. Reviewed expectations of the first 24 hours as well as the second night. To assist mom with next feeding when infant is awake and cueing.

## 2022-04-11 NOTE — Lactation Note (Signed)
This note was copied from a baby's chart.  Assisted mom with latch on the right breast in the cross cradle hold. Infant would latch and take several sucks and come off. After several attempts infant maintained the latch and sucked for several minutes and fell asleep. Mom burped infant and placed her to her left breast in the cross cradle hold and infant latched and nursed for several minutes and came off the breast content. Lactation consultant will follow up tomorrow.

## 2022-04-11 NOTE — Progress Notes (Signed)
Shift assessment complete as noted. Patient resting comfortably. Questions encouraged and answered. Encouraged to call for needs or concerns. Verbalizes understanding.

## 2022-04-11 NOTE — Progress Notes (Signed)
EPDS 0

## 2022-04-12 MED ORDER — IBUPROFEN 800 MG PO TABS
800 | ORAL_TABLET | Freq: Three times a day (TID) | ORAL | 3 refills | 20.00000 days | Status: DC
Start: 2022-04-12 — End: 2024-03-10

## 2022-04-12 MED ORDER — OXYCODONE HCL 5 MG PO TABS
5 MG | ORAL_TABLET | ORAL | 0 refills | Status: AC | PRN
Start: 2022-04-12 — End: 2022-04-15

## 2022-04-12 MED FILL — IBUPROFEN 800 MG PO TABS: 800 MG | ORAL | Qty: 1

## 2022-04-12 MED FILL — TYLENOL EXTRA STRENGTH 500 MG PO TABS: 500 MG | ORAL | Qty: 2

## 2022-04-12 MED FILL — FAMOTIDINE 20 MG PO TABS: 20 MG | ORAL | Qty: 1

## 2022-04-12 NOTE — Lactation Note (Signed)
This note was copied from a baby's chart.  In to follow up with mom and infant prior to discharge to home. Mom stated that infant has been sleeping and not interested in nursing. Mom was feeding infant expressed colostrum with curve tip syringe. Reviewed discharge information as well as feeding plan. Also instructed mom to pump when infant does not nurse. Mom and infant are following up with [facility] and will see lactation consultant there.

## 2022-04-12 NOTE — Lactation Note (Signed)
This note was copied from a baby's chart.  Mom and baby are going home today.  Continue to offer the breast without restriction.  Mom's milk should be fully in over the next few days.  Reviewed engorgement precautions.  Hand Expression has been demoed and written hand-out reviewed.  As milk comes in baby will be more alert at the breast and swallows will be more obvious.  Breasts may feel softer once baby has finished nursing.  Baby should be back to birth weight by 2 weeks of age.  And then gain on average 1 oz per day for the next 2-3 months.  Reviewed babies should be exclusively breastfeeding for the first 6 months and that breastfeeding should continue after introduction of appropriate complimentary foods after 6 months.  Initial output should be at least 1 wet and 1 bowel movement for each day old baby is.  By day 5-7 once milk is fully in baby will consistently have 6 or more soaking wet diapers and about 4 bowel movement.  Some babies have a bowel movement with every feeding and some have 1-3 large bowel movements each day.  Inadequate output may indicate inadequate feedings and should be reported to your Pediatrician.  Bowel habits may change as baby gets older.  Encouraged follow-up at Pediatrician in 1-2 days, no later than 1 week of age.  Call OP Lactation Center for any questions as needed or to set up an OP visit.  OP phone calls are returned within 24 hours. Community Breastfeeding Resource List given.

## 2022-04-12 NOTE — Care Coordination-Inpatient (Signed)
Initial assessment completed by Louisa Kirk (social worker).  This social worker will continue to follow through discharge.    Charo Philipp Z. Nikisha Fleece, LISW-CP, PMH-C  [facility]   [phone]

## 2022-04-12 NOTE — Progress Notes (Signed)
PPD#2 NSVD, no c/o  AFVSS  Abd-soft NTND  Ext-no CT  PLan: Rtn pp care

## 2022-04-12 NOTE — Lactation Note (Signed)
This note was copied from a baby's chart.  Individualized Feeding Plan for Breastfeeding   Lactation Services [phone]    As much as possible, hold your baby on your chest so baby's bare skin is against your bare skin with a blanket covering baby's back, especially 30 minutes before it is time for baby to eat.    Watch for early feeding cues such as, licking lips, sucking motions, rooting, hands to mouth. Crying is a late feeding cue.      Feed your baby at least 8 times in 24 hours, or more if your baby is showing feeding cues.  If baby is sleepy put baby skin to skin and watch for hunger cues.  To rouse baby: unwrap, undress, massage hands, feet, & back, change diaper, gently change baby's position from lying to sitting.   15-20 minutes on the first breast of active breastfeeding is considered a good feeding. Good, active breastfeeding is when baby is alert, tugging the nipple, their ear may move, and you can hear swallows.  Allow baby to finish the first side before changing sides.     Sleeping at the breast or only brief, light sucks should not be considered a good, full breastfeed.  At each feeding:    __x__3.  Baby needs to NURSE WELL x 15-20 minutes on at least first breast, burp and offer 2nd breast at every feeding.  If no sustained latch only attempt at breast for 10 minutes.     If baby does not latch on and feed well on at least one side, you should:   __x__4. Double pump for 15 minutes with breast massage and compression.  Hand express for an additional 2-3 minutes per side. Pump after each feeding attempt or poor feeding, up to 8 times per day. If you are not putting baby to the breast you need to pump 8 times a day. Pump every 3 hours.    __x__5. Give baby all of the breast milk you obtain using a straight syringe or  curved syringe.    If baby does NOT have enough wet and dirty diapers per day, is jaundiced/lethargic, or has significant weight loss AND you do NOT pump enough milk for each  feeding (per volume listed below), formula supplementation may need to be used. Call lactation department /pediatrician if you have concerns.     AVERAGE INTAKES OF COLOSTRUM BY HEALTHY BREASTFED INFANTS:  Time  Day Intake (ml per feeding)  Based on 8 feedings per day.  24-48 hrs  2 5-15 ml  48-72 hrs  3 15-30 ml (0.5-1 oz) Based on every 3 hour feeds  72-96 hrs  4 30-45 ml (1-1.5oz)                          5-6      45-60 ml (1.5-2oz)                           7        75-90 ml (2.5-3 oz)      By day 7, baby will need 79 ml or 2.5 oz at each feeding based on 8 feedings per day & baby's weight. (1oz = 62ml).  Total milk volume needed in 24 hours by Day 7 is 21.2 oz per day based on baby's birthweight of 7 lbs 15 oz. The more often baby eats, the less volume they need per  feeding.  If baby is eating more often than the minimum of 8 times per day, they may take less per feeding    If pumping, suggest using olive oil or coconut oil on your nipples before pumping to help reduce the friction.    Outpatient services are located on the 4th floor at [facility]. Check in at the 4th floor registration desk (the same one you used when you came to have your baby).  Call for questions [phone]

## 2022-04-12 NOTE — Progress Notes (Signed)
Shift assessment complete see flowsheet. Discussed today plan of care with pt. Bleeding precautions given. No s/s of distress noted at this time. Questions encouraged and answered. Pt to call with needs/concerns. Pt in bed with call light in reach

## 2022-04-12 NOTE — Discharge Instructions (Signed)
After Your Delivery (the Postpartum Period): Care Instructions  Overview     Congratulations on the birth of your baby. Like pregnancy, the newborn period can be a time of excitement, joy, and exhaustion. You may look at your wondrous little baby and feel happy. You may also be overwhelmed by your new sleep hours and new responsibilities.  At first, babies often sleep during the days and are awake at night. They do not have a pattern or routine. They may make sudden gasps, jerk themselves awake, or look like they have crossed eyes. These are all normal, and they may even make you smile.  In these first weeks after delivery, try to take good care of yourself. It may take 4 to 6 weeks to feel like yourself again, and possibly longer if you had a Cesarean birth. You will likely feel very tired for several weeks. Your days will be full of ups and downs, but lots of joy as well.  Follow-up care is a key part of your treatment and safety. Be sure to make and go to all appointments, and call your doctor if you are having problems. It's also a good idea to know your test results and keep a list of the medicines you take.  How can you care for yourself at home?  Take care of your body after delivery  Use pads instead of tampons for the bloody flow that may last as long as 2 weeks.  Ease cramps with ibuprofen (Advil, Motrin).  Ease soreness of hemorrhoids and the area between your vagina and rectum with ice compresses or witch hazel pads.  Ease constipation by drinking lots of fluid and eating high-fiber foods. Ask your doctor about over-the-counter stool softeners.  Cleanse yourself with a gentle squeeze of warm water from a bottle instead of wiping with toilet paper.  Take a sitz bath in warm water several times a day.  Wear a good nursing bra. Ease sore and swollen breasts with warm, wet washcloths.  If you aren't breastfeeding, use ice rather than heat for breast soreness.  Your period may not start for several  months if you are breastfeeding. You may bleed more, and longer at first, than you did before you got pregnant.  Wait until you are healed (about 4 to 6 weeks) before you have sex. Ask your doctor when it is okay for you to have sex.  Try not to travel with your baby for 5 or 6 weeks. If you take a long car trip, make frequent stops to walk around and stretch.    Pelvic rest for 6wk  NO driving for 2wk or while taking narcotics  NO tub baths, pools, or hot tubs for 6wk   Avoid exhaustion  Rest every day. Try to nap when your baby naps.  Ask another adult to be with you for a few days after delivery.  Plan for child care if you have other children.  Stay flexible so you can eat at odd hours and sleep when you need to. Both you and your baby are making new schedules.  Plan small trips to get out of the house. Change can make you feel less tired.  Ask for help with housework, cooking, and shopping. Remind yourself that your job is to care for your baby.  Know about help for postpartum depression  "Baby blues" are common for the first 1 to 2 weeks after birth. You may cry or feel sad or irritable for no reason.  Rest  whenever you can. Being tired makes it harder to handle your emotions.  Go for walks with your baby.  Talk to your partner, friends, and family about your feelings.  If your symptoms last for more than a few weeks, or if you feel very depressed, ask your doctor for help.  Postpartum depression can be treated. Support groups and counseling can help. Sometimes medicine can also help.  Stay healthy  Eat healthy foods so you have more energy.  If you breastfeed, avoid drugs. If you quit smoking during pregnancy, try to stay smoke-free. If you choose to have a drink now and then, have only one drink, and limit the number of occasions that you have a drink. Wait to breastfeed at least 2 hours after you have a drink to reduce the amount of alcohol the baby may get in the milk.  Start daily exercise after 4 to 6  weeks, but rest when you feel tired.  Learn exercises to tone your belly. Try Kegel exercises to regain strength in your pelvic muscles. You can do these exercises while you stand or sit. (If doing these exercises causes pain, stop doing them and talk with your doctor.)  Squeeze your muscles as if you were trying not to pass gas. Or squeeze your muscles as if you were stopping the flow of urine. Your belly, legs, and buttocks shouldn't move.  Hold the squeeze for 3 seconds, then relax for 5 to 10 seconds.  Start with 3 seconds, then add 1 second each week until you are able to squeeze for 10 seconds.  Repeat the exercise 10 times a session. Do 3 to 8 sessions a day.  Find a class for you and your baby that has an exercise time.  If you had a Cesarean birth, give yourself a bit more time before you exercise, and be careful.  When should you call for help?  Share this information with your partner, family, or a friend. They can help you watch for warning signs.  Call 911  anytime you think you may need emergency care. For example, call if:    You have thoughts of harming yourself, your baby, or another person.     You passed out (lost consciousness).     You have chest pain, are short of breath, or cough up blood.     You have a seizure.   Call your doctor now or seek immediate medical care if:    You have signs of hemorrhage (too much bleeding), such as:  Heavy vaginal bleeding. This means that you are soaking through one or more pads in an hour. Or you pass blood clots bigger than an egg.  Feeling dizzy or lightheaded, or you feel like you may faint.  Feeling so tired or weak that you cannot do your usual activities.  A fast or irregular heartbeat.  New or worse belly pain.     You have signs of infection, such as:  A fever.  Vaginal discharge that smells bad.  New or worse belly pain.     You have symptoms of a blood clot in your leg (called a deep vein thrombosis), such as:  Pain in the calf, back of the knee,  thigh, or groin.  Redness and swelling in your leg or groin.     You have signs of preeclampsia, such as:  Sudden swelling of your face, hands, or feet.  New vision problems (such as dimness, blurring, or seeing spots).  A severe  headache.   Watch closely for changes in your health, and be sure to contact your doctor if:    Your vaginal bleeding isn't decreasing.     You feel sad, anxious, or hopeless for more than a few days.     You are having problems with your breasts or breastfeeding.   Where can you learn more?  Go to https://www.healthwise.net/patientEd and enter A461 to learn more about "After Your Delivery (the Postpartum Period): Care Instructions."  Current as of: December 07, 2021               Content Version: 13.8   2006-2023 [employer].   Care instructions adapted under license by [facility]. If you have questions about a medical condition or this instruction, always ask your healthcare professional. [employer] disclaims any warranty or liability for your use of this information.

## 2022-04-12 NOTE — Progress Notes (Signed)
04/12/22 1237   AVS Reviewed   AVS & discharge instructions reviewed with patient and/or representative? Yes   Reviewed instructions with Patient   Level of Understanding Questions answered;Verbalized understanding

## 2022-04-12 NOTE — Progress Notes (Signed)
Patient discharged to home per MD orders.  Discharge instructions reviewed with patient an pt given a copy. Questions encouraged and answered. Patient verbalizes understanding. Patient escorted by [facility] staff to private vehicle. Pt declines w/c for d/c. Stable at discharge.

## 2022-04-12 NOTE — Discharge Summary (Signed)
Obstetrical Discharge Summary     Name: Amanda Perry  MRN: [id number]   SSN: @SSNMASKED @     Date of Birth: 1995-09-10   Age: 26 y.o.  Sex: female       Allergies: No Known Allergies     Admit Date:  04/10/2022     Discharge Date:       Admitting Physician: @[email or web address] @     Attending Physician:  Adolphus Birchwood, MD     * Admission Diagnoses:  Normal labor [O80, Z37.9]  Abdominal pain affecting pregnancy [O26.899, R10.9]     * Discharge Diagnoses:     Amanda Perry, Amanda Perry [[id number]]      Newborn Information    Head delivery date/time: 04/10/2022 15:58:14   Changing the newborn's delivery date/time could affect patient care.:      Delivery date/time:  04/10/22 1558   Delivery type: Vaginal, Spontaneous   C-Section Details:                       * Procedures: No admission procedures for hospital encounter.     * Discharge Condition: Good    [facility] Course: Normal hospital course following the delivery.    * Disposition: Home    Discharge Medications:      Medication List        START taking these medications      oxyCODONE 5 MG immediate release tablet  Commonly known as: ROXICODONE  Take 1 tablet by mouth every 4 hours as needed for Pain for up to 3 days. Max Daily Amount: 30 mg            CHANGE how you take these medications      ibuprofen 800 MG tablet  Commonly known as: ADVIL;MOTRIN  Take 1 tablet by mouth every 8 hours  What changed:   when to take this  reasons to take this            STOP taking these medications      Biotin 2.5 MG Caps     cyanocobalamin 100 MCG tablet     vitamin D 25 MCG (1000 UT) Caps               Where to Get Your Medications        These medications were sent to [id number] - [place], [place]  [address], [place] [address]      Phone: [phone]   ibuprofen 800 MG tablet  oxyCODONE 5 MG immediate release tablet          * Follow-up Care/Patient Instructions:  Activity: activity as tolerated  Diet: regular diet  Wound Care:  keep wound clean and dry    Follow-up Information

## 2023-05-31 NOTE — L&D Delivery Note (Signed)
 "Amanda Perry, Amanda Perry [[id number]]      Labor Events    Preterm Labor: No  Cervical Ripening Date/Time:      Antibiotics Received during Labor: Yes  Rupture Date/Time:  03/09/24 09:30:00   Rupture Type: SROM  Fluid Color: Clear  Fluid Volume: Scant       Anesthesia    Method: Epidural       Labor Event Times      Labor onset date/time:        Dilation complete date/time:  03/09/24 09:30:00     Start pushing date/time:  03/09/2024 10:15:09   Decision date/time (emergent c-section):            Delivery Details      Delivery Date: 03/09/24 Delivery Time: 11:09:42   Delivery Type: Vaginal, Spontaneous              Newborn Presentation    Presentation: Vertex  Position: Left  _: Occiput  _: Anterior       Shoulder Dystocia    Shoulder Dystocia Present?: No       Assisted Delivery Details    Forceps Attempted?: No  Vacuum Extractor Attempted?: No                           Cord    Vessels: 3 Vessels  Complications: None  Delayed Cord Clamping?: Yes  Cord Clamped Date/Time: 03/09/2024 11:12:05  Cord Blood Disposition: Lab  Gases Sent?: No              Placenta    Date/Time: 03/09/2024 11:14:38  Removal: Spontaneous  Appearance: Intact  Disposition: Discarded       Lacerations    Episiotomy: None  Perineal Lacerations: None  Other Lacerations: no non-perineal laceration       Vaginal Counts    Initial Count Personnel: Aleyda Gindlesperger,MD  Initial Count Verified By: NASARIO OBIE Mantle Sponge Count: Correct Intial Needles Count: Correct    Final Sponges Count: Correct Final Needles  Count: Correct    Final Count Personnel: Ishmel Acevedo,MD  Final Count Verified By: NASARIO OBIE  Accurate Final Count?: Yes       Blood Loss  Mother: Amanda Perry, Amanda Perry #[id number]     Start of Mother's Information      Delivery Blood Loss   Intrapartum & Postpartum: 03/08/24 2309 - 03/09/24 1129    Delivery Admission: 03/08/24 2309 - 03/09/24 1129         Intrapartum & Postpartum Delivery Admission    None                  End of Mother's Information  Mother: Amanda Perry, Amanda Perry  #[id number]                Delivery Providers    Delivering clinician: Kem Leita ORN, MD     Provider Role    Kem Leita ORN, MD Obstetrician    Nasario Patron, RN Primary Nurse    Claudene Bottcher, RN Primary Newborn Nurse    Claudene Bottcher, RN Charge Nurse              Newborn Assessment    Living Status: Living  Delivery Location Comment: 434        Skin Color:   Heart Rate:   Reflex Irritability:   Muscle Tone:   Respiratory Effort:   Total:            1 Minute:    0  2    2    2    2    8         5  Minute:    1    2    2    2    2    9                                         Apgars Assigned By: CLAUDENE PEAK              Resuscitation    Method: Bulb Suction, Stimulation             Newborn Measurements      Birth Weight: 4040 g     Head Circumference: 35.5 cm     Chest Circumference: 34.5 cm                Delivery Note    Obstetrician:  Leita Mason, MD    Assistant: none    Pre-Delivery Diagnosis: Term pregnancy, Spontaneous labor, and Single fetus    Post-Delivery Diagnosis: Living newborn infant(s) and Female    Intrapartum Event: None    Procedure: Spontaneous vaginal delivery    Epidural: Yes    Monitor:  Fetal Heart Tones - External and Uterine Contractions - External    Indications for instrumental delivery: none    Estimated Blood Loss: see qbl    Episiotomy: none    Laceration(s):  none    Laceration(s) repair: Yes    Presentation: Cephalic    Fetal Description: singleton    Fetal Position: Left Occiput Anterior    BABY INFORMATION  NAME:   Information for the patient's newborn:  Shamiyah, Ngu [[id number]]   Boy Sor Delores  SEX: female  DATE AND TIME OF BIRTH:   Information for the patient's newborn:  Mauriana, Dann [[id number]]   03/09/2024 at   Information for the patient's newborn:  Panagiota, Perfetti [[id number]]   1109  BIRTH MEASUREMENTS:   Information for the patient's newborn:  Roshell, Brigham [[id number]]     and   Information for the patient's newborn:  Jamisyn, Langer [[id number]]     .  APGARS:  Information for the patient's newborn:  Shadara, Lopez [[id number]]       Information for the patient's newborn:  Alizandra, Loh [[id number]]        Umbilical Cord: 3 vessels present and Cord blood sent to lab for type, Rh, and Coombs' test    Specimens: na           Complications:  none           Lab Results   Component Value Date/Time    ABORH O POSITIVE 03/09/2024 07:53 AM            Attending Attestation: I was present and scrubbed for the entire procedure.       "

## 2023-07-08 IMAGING — MR MRI CERVICAL SPINE WITHOUT CONTRAST
7 of 12 series · 11 of 48 positions shown · IV contrast (gadolinium)
Comparison: None.

________________________________________________________________________________________________ 
MRI CERVICAL SPINE WITHOUT CONTRAST, 07/08/2023 [DATE]: 
CLINICAL INDICATION: Cervicalgia . History of injuries to the neck.
TECHNIQUE: Multiplanar, multiecho position MR images of the cervical spine were 
performed without intravenous gadolinium enhancement. Patient was scanned on a 
1.5T magnet.

[Series 101: survey* · axial · 10.0mm · 1.56mm/px · z∈[-30,+199]mm · 2 of 15 slices shown]
[im 1/15]
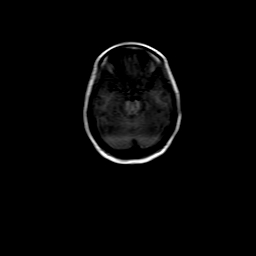
[im 15/15]
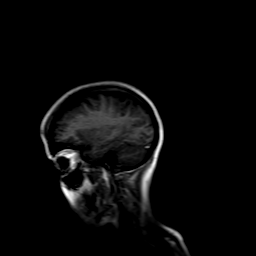

[Series 201: t2w_cor-surv · coronal · 5.0mm · 0.69mm/px · 1 of 7 slices shown]
[im 1/7]
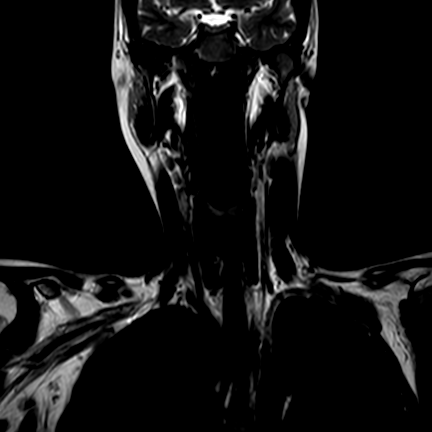

[Series 301: T1 · sagittal · 3.0mm · 0.39mm/px · 1 of 15 slices shown]
[im 1/15]
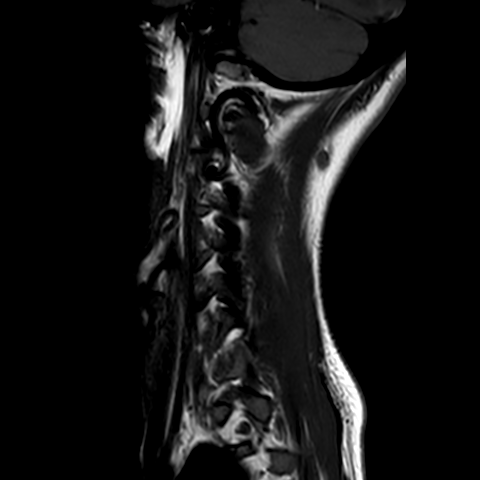

[Series 402: (id)_mdixon_tse · sagittal · 3.0mm · 0.35mm/px · 1 of 15 slices shown]
[im 1/15]
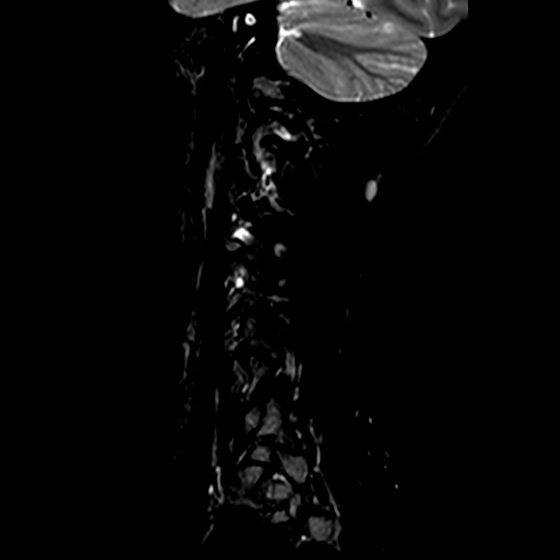

[Series 403: st2w_mdixon_tse · sagittal · 3.0mm · 0.35mm/px · 1 of 15 slices shown]
[im 1/15]
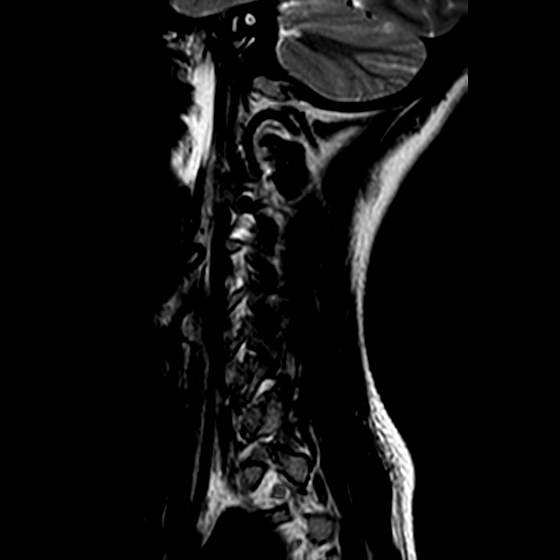

[Series 504: csp oblq left · oblique · 1.0mm · 0.15mm/px · 3 of 32 slices shown]
[im 1/32]
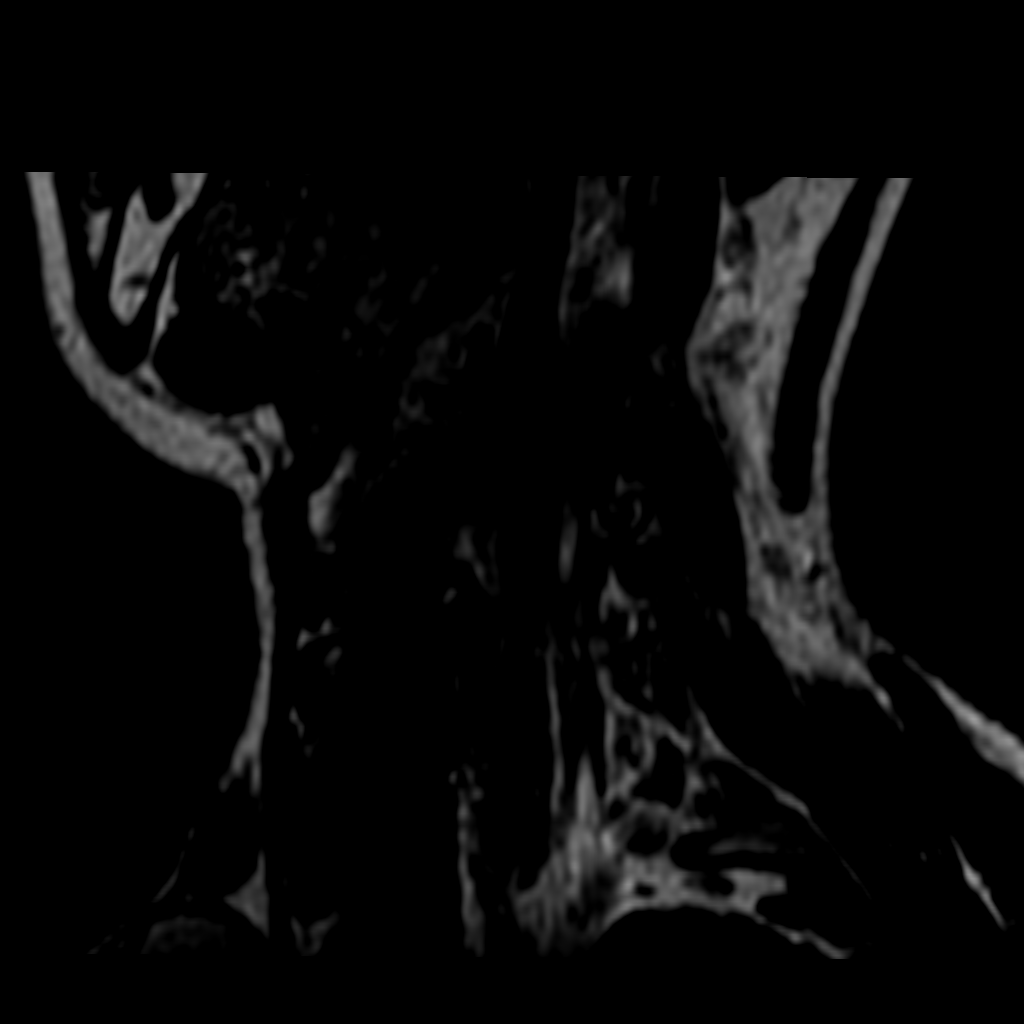
[im 16/32]
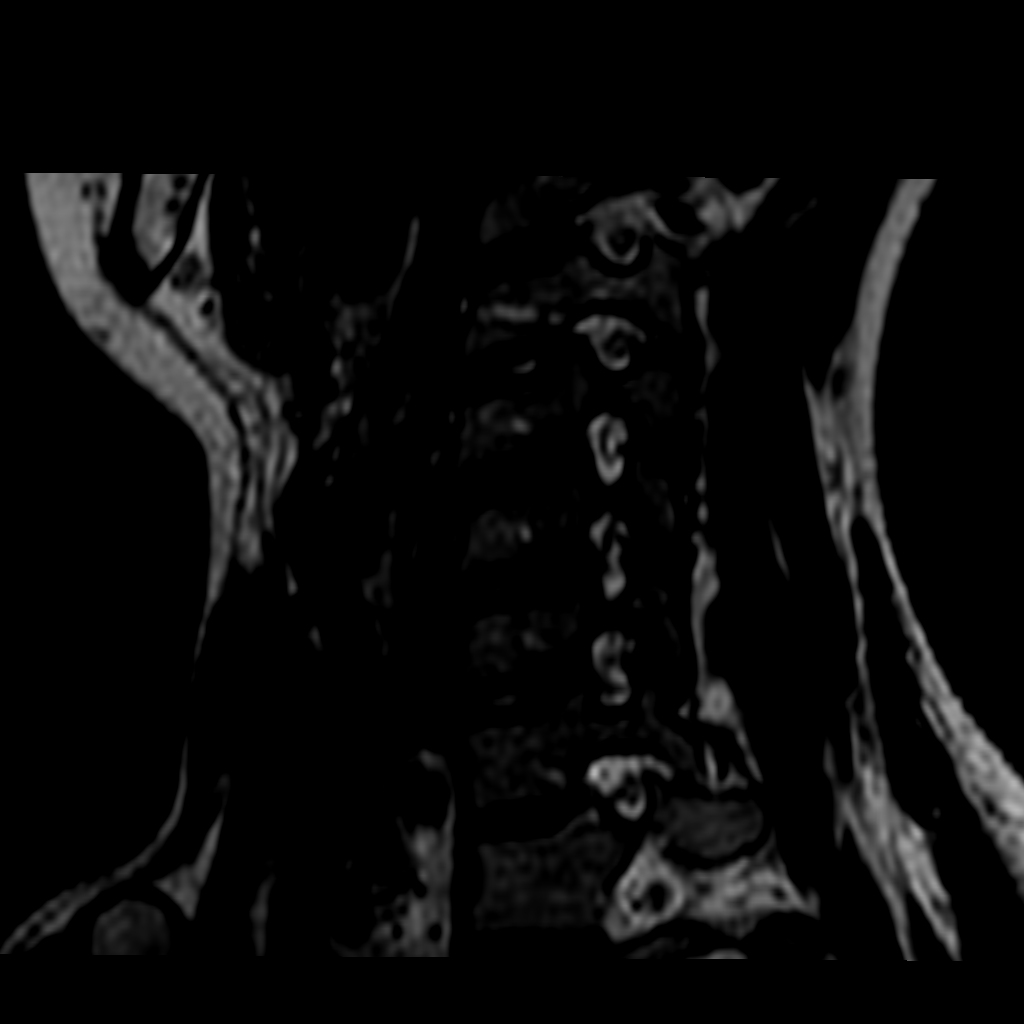
[im 32/32]
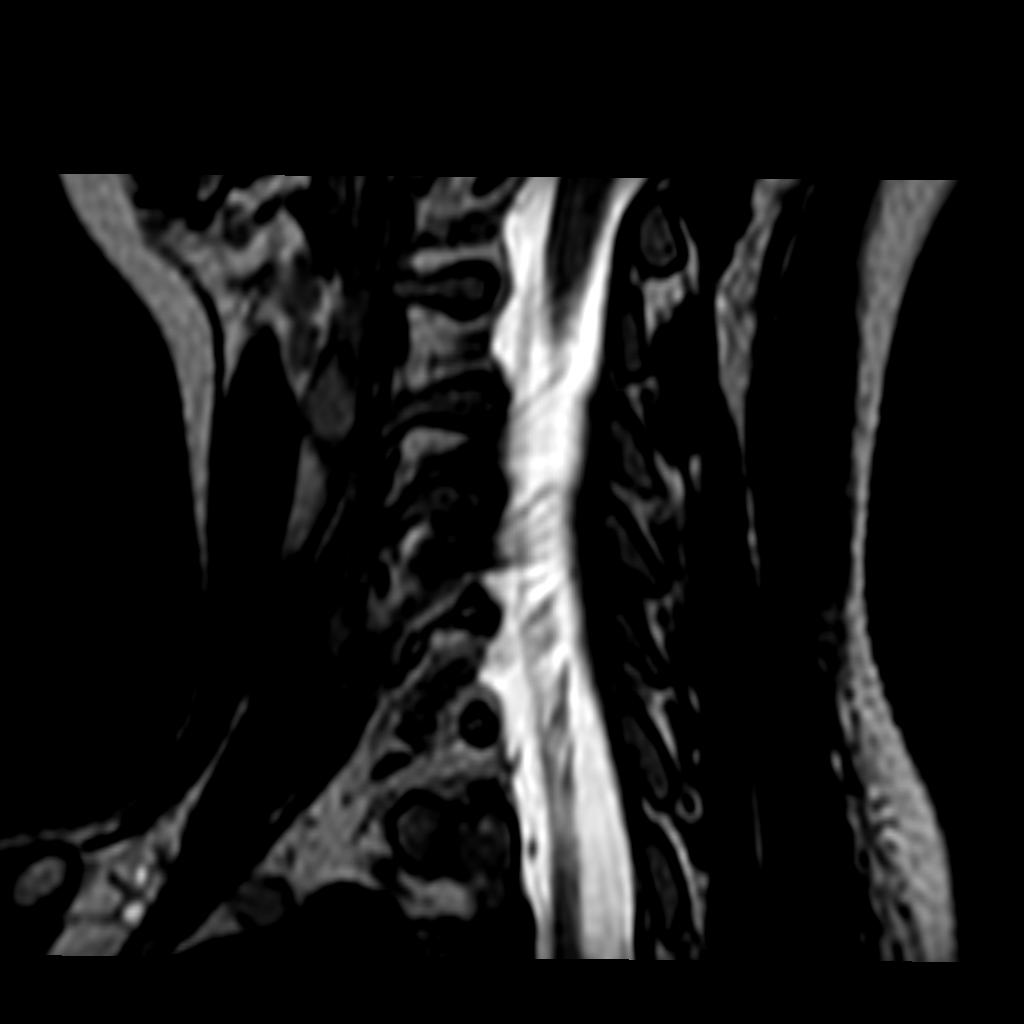

[Series 601: T2 · axial · 3.0mm · 0.29mm/px · z∈[-228,-148]mm · 2 of 18 slices shown]
[im 1/18]
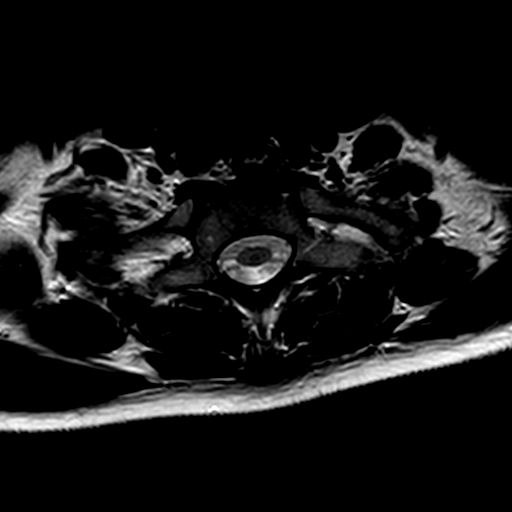
[im 18/18]
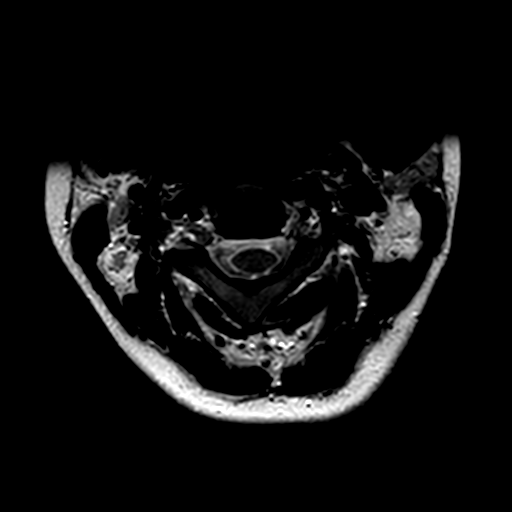

[11 of 48 positions shown; findings below may reference images not displayed]

FINDINGS: -------------------------------------------------------------------------------- 
----------------- 
GENERAL: 
ALIGNMENT: Normal. 
VERTEBRAL BODY HEIGHT: Normal.  
MARROW SIGNAL: No focal suspect signal abnormality. 
CORD SIGNAL: Normal.  
ADDITIONAL FINDINGS: None. 
-------------------------------------------------------------------------------- 
---------------- 
SEGMENTAL: 
CRANIOCERVICAL JUNCTION: No significant stenosis. 
C2-C3: No significant central canal narrowing.  No significant right neural 
foraminal narrowing. No significant left neural foraminal narrowing.  
C3-C4: Minimal disc bulge without significant central canal narrowing.  No 
significant right neural foraminal narrowing. No significant left neural 
foraminal narrowing.  
C4-C5: Minimal disc bulge without significant central canal narrowing.  No 
significant right neural foraminal narrowing. No significant left neural 
foraminal narrowing.  
C5-C6: Mild disc bulge eccentric to the left with left uncovertebral joint 
hypertrophy; mild left-sided central canal narrowing.  No significant right 
neural foraminal narrowing. Mild left neural foraminal narrowing.  
C6-C7: Minimal disc bulge without significant central canal narrowing.  No 
significant right neural foraminal narrowing. No significant left neural 
foraminal narrowing.  
C7-T1: No significant central canal narrowing.  No significant right neural 
foraminal narrowing. No significant left neural foraminal narrowing.  
-------------------------------------------------------------------------------- 
---------------
IMPRESSION: 1.  Discogenic/degenerative changes as above. 
2.  No severe central canal narrowing.  
3.  No severe neural foraminal narrowing.

## 2024-03-09 ENCOUNTER — Inpatient Hospital Stay
Admission: EM | Admit: 2024-03-09 | Discharge: 2024-03-10 | Disposition: A | Discharge status: Home / Self Care | Payer: BLUE CROSS/BLUE SHIELD | Source: Other Acute Inpatient Hospital | Attending: Obstetrics & Gynecology | Admitting: Obstetrics & Gynecology

## 2024-03-09 DIAGNOSIS — Z3A40 40 weeks gestation of pregnancy: Secondary | ICD-10-CM

## 2024-03-09 DIAGNOSIS — O480 Post-term pregnancy: Secondary | ICD-10-CM

## 2024-03-09 LAB — CBC WITH AUTO DIFFERENTIAL
Basophils %: 0.3 % (ref 0.0–2.0)
Basophils Absolute: 0.03 K/UL (ref 0.00–0.20)
Eosinophils %: 1 % (ref 0.5–7.8)
Eosinophils Absolute: 0.1 K/UL (ref 0.00–0.80)
Hematocrit: 38.8 % (ref 35.8–46.3)
Hemoglobin: 13.1 g/dL (ref 11.7–15.4)
Immature Granulocytes %: 0.8 % (ref 0.0–5.0)
Immature Granulocytes Absolute: 0.08 K/UL (ref 0.0–0.5)
Lymphocytes %: 17.8 % (ref 13.0–44.0)
Lymphocytes Absolute: 1.7 K/UL (ref 0.50–4.60)
MCH: 29.5 pg (ref 26.1–32.9)
MCHC: 33.8 g/dL (ref 31.4–35.0)
MCV: 87.4 FL (ref 82.0–102.0)
MPV: 10 FL (ref 9.4–12.3)
Monocytes %: 6.1 % (ref 4.0–12.0)
Monocytes Absolute: 0.58 K/UL (ref 0.10–1.30)
Neutrophils %: 74 % (ref 43.0–78.0)
Neutrophils Absolute: 7.04 K/UL (ref 1.70–8.20)
Platelets: 267 K/uL (ref 150–450)
RBC: 4.44 M/uL (ref 4.05–5.2)
RDW: 12.5 % (ref 11.9–14.6)
WBC: 9.5 K/uL (ref 4.3–11.1)
nRBC: 0 K/uL (ref 0.0–0.2)

## 2024-03-09 LAB — TYPE AND SCREEN
ABO/Rh: O POS
Antibody Screen: NEGATIVE

## 2024-03-09 LAB — T PALLIDUM SCREEN W/REFLEX: Syphillis T. Palladium Ab w/Reflex to RPR: NONREACTIVE

## 2024-03-09 MED ORDER — DOCUSATE SODIUM 100 MG PO CAPS
100 | Freq: Two times a day (BID) | ORAL | Status: DC | PRN
Start: 2024-03-09 — End: 2024-03-10
  Administered 2024-03-10: 13:00:00 100 mg via ORAL

## 2024-03-09 MED ORDER — ONDANSETRON 4 MG PO TBDP
4 | Freq: Four times a day (QID) | ORAL | Status: DC | PRN
Start: 2024-03-09 — End: 2024-03-10

## 2024-03-09 MED ORDER — SODIUM CHLORIDE 0.9 % IV SOLN (ADDEASE)
0.9 | Freq: Once | INTRAVENOUS | Status: AC
Start: 2024-03-09 — End: 2024-03-09
  Administered 2024-03-09: 13:00:00 5 10*6.[IU] via INTRAVENOUS

## 2024-03-09 MED ORDER — OXYCODONE HCL 5 MG PO TABS
5 | ORAL | Status: DC | PRN
Start: 2024-03-09 — End: 2024-03-10

## 2024-03-09 MED ORDER — MEDELA TENDER CARE LANOLIN EX CREA
CUTANEOUS | Status: DC | PRN
Start: 2024-03-09 — End: 2024-03-10

## 2024-03-09 MED ORDER — LACTATED RINGERS IV BOLUS
INTRAVENOUS | Status: DC | PRN
Start: 2024-03-09 — End: 2024-03-09

## 2024-03-09 MED ORDER — LACTATED RINGERS IV SOLN
INTRAVENOUS | Status: DC
Start: 2024-03-09 — End: 2024-03-09
  Administered 2024-03-09: 12:00:00 via INTRAVENOUS

## 2024-03-09 MED ORDER — SODIUM CHLORIDE 0.9 % IV SOLN
0.9 | INTRAVENOUS | Status: DC | PRN
Start: 2024-03-09 — End: 2024-03-09

## 2024-03-09 MED ORDER — CARBOPROST TROMETHAMINE 250 MCG/ML IM SOLN
250 | INTRAMUSCULAR | Status: DC | PRN
Start: 2024-03-09 — End: 2024-03-09

## 2024-03-09 MED ORDER — OXYTOCIN 0.06 UNIT/ML BOLUS FROM THE BAG (POST-PARTUM)
30 | INTRAVENOUS | Status: DC | PRN
Start: 2024-03-09 — End: 2024-03-09

## 2024-03-09 MED ORDER — BENZOCAINE-MENTHOL 20-0.5 % EX AERO
20-0.5 | CUTANEOUS | Status: DC | PRN
Start: 2024-03-09 — End: 2024-03-10
  Administered 2024-03-09 – 2024-03-10 (×2): via TOPICAL

## 2024-03-09 MED ORDER — EPHEDRINE SULFATE (PRESSORS) 50 MG/5ML IV SOSY
50 | Freq: Once | INTRAVENOUS | Status: DC | PRN
Start: 2024-03-09 — End: 2024-03-09
  Administered 2024-03-09: 13:00:00 10 via INTRAVENOUS

## 2024-03-09 MED ORDER — FENTANYL CITRATE (PF) 100 MCG/2ML IJ SOLN
100 | INTRAMUSCULAR | Status: AC
Start: 2024-03-09 — End: 2024-03-09

## 2024-03-09 MED ORDER — NORMAL SALINE FLUSH 0.9 % IV SOLN
0.9 | Freq: Two times a day (BID) | INTRAVENOUS | Status: DC
Start: 2024-03-09 — End: 2024-03-10

## 2024-03-09 MED ORDER — MISOPROSTOL 200 MCG PO TABS
200 | ORAL | Status: DC | PRN
Start: 2024-03-09 — End: 2024-03-09

## 2024-03-09 MED ORDER — ONDANSETRON 4 MG PO TBDP
4 | Freq: Four times a day (QID) | ORAL | Status: DC | PRN
Start: 2024-03-09 — End: 2024-03-09

## 2024-03-09 MED ORDER — POLYETHYLENE GLYCOL 3350 17 G PO PACK
17 | Freq: Every day | ORAL | Status: DC
Start: 2024-03-09 — End: 2024-03-10

## 2024-03-09 MED ORDER — ACETAMINOPHEN 325 MG PO TABS
325 | ORAL | Status: DC | PRN
Start: 2024-03-09 — End: 2024-03-09

## 2024-03-09 MED ORDER — TETANUS-DIPHTH-ACELL PERTUSSIS 5-2.5-18.5 LF-MCG/0.5 IM SUSY
5-2.5-18.5 | INTRAMUSCULAR | Status: DC
Start: 2024-03-09 — End: 2024-03-10

## 2024-03-09 MED ORDER — ROPIVACAINE HCL 2 MG/ML IJ SOLN
Freq: Once | INTRAMUSCULAR | Status: DC | PRN
Start: 2024-03-09 — End: 2024-03-09
  Administered 2024-03-09: 13:00:00 4 via EPIDURAL
  Administered 2024-03-09: 13:00:00 10 via EPIDURAL
  Administered 2024-03-09: 13:00:00 4 via EPIDURAL

## 2024-03-09 MED ORDER — FENTANYL CITRATE (PF) 100 MCG/2ML IJ SOLN
100 | Freq: Once | INTRAMUSCULAR | Status: DC | PRN
Start: 2024-03-09 — End: 2024-03-09
  Administered 2024-03-09: 13:00:00 100 via EPIDURAL

## 2024-03-09 MED ORDER — DIPHENHYDRAMINE HCL 50 MG/ML IJ SOLN
50 | INTRAMUSCULAR | Status: DC | PRN
Start: 2024-03-09 — End: 2024-03-09

## 2024-03-09 MED ORDER — BUTORPHANOL TARTRATE 2 MG/ML IJ SOLN
2 | INTRAMUSCULAR | Status: DC | PRN
Start: 2024-03-09 — End: 2024-03-09

## 2024-03-09 MED ORDER — PENICILLIN G POTASSIUM IN NS 2500000 UNIT/100 ML IVPB
2500000 | INTRAVENOUS | Status: DC
Start: 2024-03-09 — End: 2024-03-09

## 2024-03-09 MED ORDER — TRANEXAMIC ACID-NACL 1000-0.7 MG/100ML-% IV SOLN
1000-0.7 | Freq: Once | INTRAVENOUS | Status: DC | PRN
Start: 2024-03-09 — End: 2024-03-09

## 2024-03-09 MED ORDER — SIMETHICONE 80 MG PO CHEW
80 | Freq: Four times a day (QID) | ORAL | Status: DC | PRN
Start: 2024-03-09 — End: 2024-03-10

## 2024-03-09 MED ORDER — NORMAL SALINE FLUSH 0.9 % IV SOLN
0.9 | INTRAVENOUS | Status: DC | PRN
Start: 2024-03-09 — End: 2024-03-10

## 2024-03-09 MED ORDER — OXYTOCIN 30 UNITS IN 500 ML INFUSION
30 | INTRAVENOUS | Status: DC | PRN
Start: 2024-03-09 — End: 2024-03-09

## 2024-03-09 MED ORDER — OXYTOCIN 30 UNITS IN 500 ML INFUSION
30 | INTRAVENOUS | Status: DC
Start: 2024-03-09 — End: 2024-03-09
  Administered 2024-03-09: 15:00:00 2 m[IU]/min via INTRAVENOUS

## 2024-03-09 MED ORDER — NORMAL SALINE FLUSH 0.9 % IV SOLN
0.9 | Freq: Two times a day (BID) | INTRAVENOUS | Status: DC
Start: 2024-03-09 — End: 2024-03-09

## 2024-03-09 MED ORDER — METHYLERGONOVINE MALEATE 0.2 MG/ML IJ SOLN
0.2 | INTRAMUSCULAR | Status: DC | PRN
Start: 2024-03-09 — End: 2024-03-09

## 2024-03-09 MED ORDER — ONDANSETRON HCL 4 MG/2ML IJ SOLN
4 | Freq: Four times a day (QID) | INTRAMUSCULAR | Status: DC | PRN
Start: 2024-03-09 — End: 2024-03-09

## 2024-03-09 MED ORDER — RHO D IMMUNE GLOBULIN 1500 UNITS IM (WRAPPER)
1500 | Freq: Once | Status: DC | PRN
Start: 2024-03-09 — End: 2024-03-10

## 2024-03-09 MED ORDER — ACETAMINOPHEN 500 MG PO TABS
500 | Freq: Four times a day (QID) | ORAL | Status: DC
Start: 2024-03-09 — End: 2024-03-10
  Administered 2024-03-09 – 2024-03-10 (×3): 1000 mg via ORAL

## 2024-03-09 MED ORDER — NORMAL SALINE FLUSH 0.9 % IV SOLN
0.9 | INTRAVENOUS | Status: DC | PRN
Start: 2024-03-09 — End: 2024-03-09

## 2024-03-09 MED ORDER — LOPERAMIDE HCL 2 MG PO CAPS
2 | ORAL | Status: DC | PRN
Start: 2024-03-09 — End: 2024-03-09

## 2024-03-09 MED ORDER — MINERAL OIL PO OIL
Freq: Once | ORAL | Status: AC | PRN
Start: 2024-03-09 — End: 2024-03-09
  Administered 2024-03-09: 14:00:00 360 mL via TOPICAL

## 2024-03-09 MED ORDER — LACTATED RINGERS IV SOLN
INTRAVENOUS | Status: DC
Start: 2024-03-09 — End: 2024-03-10

## 2024-03-09 MED ORDER — LOPERAMIDE HCL 2 MG PO CAPS
2 | ORAL | Status: DC | PRN
Start: 2024-03-09 — End: 2024-03-10

## 2024-03-09 MED ORDER — DIPHENHYDRAMINE HCL 25 MG PO CAPS
25 | ORAL | Status: DC | PRN
Start: 2024-03-09 — End: 2024-03-09

## 2024-03-09 MED ORDER — SODIUM CHLORIDE 0.9 % IV SOLN
0.9 | INTRAVENOUS | Status: DC | PRN
Start: 2024-03-09 — End: 2024-03-10

## 2024-03-09 MED ORDER — LIDOCAINE HCL 1 % IJ SOLN
1 | INTRAMUSCULAR | Status: DC | PRN
Start: 2024-03-09 — End: 2024-03-09

## 2024-03-09 MED ORDER — TERBUTALINE SULFATE 1 MG/ML IJ SOLN
1 | Freq: Once | INTRAMUSCULAR | Status: DC | PRN
Start: 2024-03-09 — End: 2024-03-09

## 2024-03-09 MED ORDER — ONDANSETRON HCL 4 MG/2ML IJ SOLN
4 | Freq: Four times a day (QID) | INTRAMUSCULAR | Status: DC | PRN
Start: 2024-03-09 — End: 2024-03-10

## 2024-03-09 MED ORDER — FAMOTIDINE 20 MG PO TABS
20 | Freq: Two times a day (BID) | ORAL | Status: DC
Start: 2024-03-09 — End: 2024-03-10

## 2024-03-09 MED ORDER — LACTATED RINGERS IV BOLUS
INTRAVENOUS | Status: DC | PRN
Start: 2024-03-09 — End: 2024-03-09
  Administered 2024-03-09: 12:00:00 1000 mL via INTRAVENOUS

## 2024-03-09 MED ORDER — WITCH HAZEL-GLYCERIN EX PADS
CUTANEOUS | Status: DC | PRN
Start: 2024-03-09 — End: 2024-03-10
  Administered 2024-03-09 – 2024-03-10 (×2): via TOPICAL

## 2024-03-09 MED ORDER — IBUPROFEN 800 MG PO TABS
800 | Freq: Four times a day (QID) | ORAL | Status: DC
Start: 2024-03-09 — End: 2024-03-10
  Administered 2024-03-09 – 2024-03-10 (×4): 800 mg via ORAL

## 2024-03-09 MED ORDER — BENZOCAINE-MENTHOL 20-0.5 % EX AERO
20-0.5 | CUTANEOUS | Status: DC | PRN
Start: 2024-03-09 — End: 2024-03-09

## 2024-03-09 MED FILL — OXYTOCIN 30 UNITS IN 500 ML INFUSION: 30 UNIT/500ML | INTRAVENOUS | Qty: 500 | Fill #0

## 2024-03-09 MED FILL — ACETAMINOPHEN EXTRA STRENGTH 500 MG PO TABS: 500 mg | ORAL | Qty: 2 | Fill #0

## 2024-03-09 MED FILL — IBUPROFEN 800 MG PO TABS: 800 mg | ORAL | Qty: 1 | Fill #0

## 2024-03-09 MED FILL — WITCH HAZEL-GLYCERIN EX PADS: CUTANEOUS | Qty: 40 | Fill #0

## 2024-03-09 MED FILL — PENICILLIN G POTASSIUM 5000000 UNITS IJ SOLR: 5000000 [IU] | INTRAMUSCULAR | Qty: 5 | Fill #0

## 2024-03-09 MED FILL — DERMOPLAST 20-0.5 % EX AERO: 20-0.5 % | CUTANEOUS | Qty: 78 | Fill #0

## 2024-03-09 MED FILL — FENTANYL CITRATE (PF) 100 MCG/2ML IJ SOLN: 100 MCG/2ML | INTRAMUSCULAR | Qty: 2 | Fill #0

## 2024-03-09 MED FILL — MINERAL OIL HEAVY PO OIL: ORAL | Qty: 360 | Fill #0

## 2024-03-09 NOTE — H&P (Signed)
"  Obstetrics History & Physical/OB ED note    Name: Amanda Perry MRN: [id number]     Date of Birth: [birth year]  Age: 28 y.o.  Sex: female      Reason for Presentation:  contractions/possible labor    HPI: Amanda Perry is a 28 y.o. G2 P1001 female at [redacted]w[redacted]d.  Contractions have been painful and regular since last night.  No LOF, VB.  +FM.  Pt desires an epidural.  Was supposed to be induced for post-dates on Monday.     Her obstetrical history is significant for uncomplicated.      Prenatal records reviewed, [facility].    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 Authorizing Provider   ibuprofen  (ADVIL ;MOTRIN ) 800 MG tablet Take 1 tablet by mouth every 8 hours 04/12/22   Kem Leita ORN, MD     No Known Allergies    Physical Exam:  VITALS:  see RN notes  CONSTITUTIONAL:  A&Ox3, breathing heavy and tearful with contractions  Cardio regular  Normal respiratory effort  ABDOMEN:  non-tender  FHTs: Reactive and reassuring; Reactive NST   Uterine activity/Contractions on monitor: Regular, every 4-5  minutes  Membranes: intact  Cervix: 6/100/-1 per RN Cherie  Presentation: cephalic by BSS    GBS positive      Assessment:  IUP at [redacted]w[redacted]d  GBS+  Actively laboring  Reassuring fetal status    Plan: admit for labor -routine labor orders and PCN for gbs ppx.  Pain control as desires- pt would like an epidural. Anticipate svd.      Communicated with: Dr. Kem who will assume care of the patient.    "

## 2024-03-09 NOTE — Progress Notes (Signed)
"  Safety Teaching reviewed:   Hand hygiene prior to handling the infant.  Use of bulb syringe  Bracelets with matching numbers are placed on mother and infant  An infant security tag  (Hugs) is placed on the infant's ankle and monitored  All OB nurses wear pink Employee badges - do not give your baby to anyone without proper identification.   Never leave the baby alone in the room.  The infant should be placed on their back to sleep.on a firm mattress. No toys should be placed in the crib. (safe sleep video offered to view)  Never shake the baby (video offered to view)  Infant fall prevention - do not sleep with the baby, and place the baby in the crib while ambulating.   Mother and Baby Care booklet given to Mother.   Bulb syringe at bedside.  "

## 2024-03-09 NOTE — Progress Notes (Signed)
"  Patient up to bathroom with RN assistance.  Peri-care taught and completed. Questions encouraged and answered.  Patient ambulating without difficulty, encouraged to call for needs or concerns. Verbalizes understanding.    "

## 2024-03-09 NOTE — Plan of Care (Signed)
"    Problem: Vaginal Birth or Cesarean Section  Goal: Fetal and maternal status remain reassuring during the birth process  Description:  Birth OB-Pregnancy care plan goal which identifies if the fetal and maternal status remain reassuring during the birth process  Outcome: Progressing     Problem: Postpartum  Goal: Experiences normal postpartum course  Description:  Postpartum OB-Pregnancy care plan goal which identifies if the mother is experiencing a normal postpartum course  Outcome: Progressing     Problem: Pain  Goal: Verbalizes/displays adequate comfort level or baseline comfort level  Outcome: Progressing     Problem: Infection - Adult  Goal: Absence of infection at discharge  Outcome: Progressing     Problem: Safety - Adult  Goal: Free from fall injury  Outcome: Progressing     Problem: Discharge Planning  Goal: Discharge to home or other facility with appropriate resources  Outcome: Progressing     "

## 2024-03-09 NOTE — Progress Notes (Signed)
"  Shift assessment complete as noted. Infant without distress . Parents encouraged to call for needs or concerns.    "

## 2024-03-09 NOTE — Progress Notes (Signed)
"  Pt complete, will start pushing.  "

## 2024-03-09 NOTE — Progress Notes (Signed)
"  OB ED     Pt to OBED with complaints of contractions all night. SABRA EFM and TOCO applied. Abd palpated soft. Positive fetal movement noted, denies LOF or VB. [facility] notified of patient's arrival.   "

## 2024-03-09 NOTE — Anesthesia Postprocedure Evaluation (Signed)
"  Department of Anesthesiology  Postprocedure Note    Patient: Amanda Perry  MRN: [id number]  Birthdate: 12-Apr-1996  Date of evaluation: 03/09/2024    Procedure Summary       Date: 03/09/24 Room / Location:     Anesthesia Start: 0843 Anesthesia Stop: 1109    Procedure: Labor Analgesia Diagnosis:     Scheduled Providers:  Responsible Provider: Chalmers Janalee Rich, MD    Anesthesia Type: epidural ASA Status: 2            Anesthesia Type: No value filed.    Aldrete Phase I:      Aldrete Phase II:      Anesthesia Post Evaluation    Patient location during evaluation: PACU  Patient participation: complete - patient participated  Level of consciousness: awake and alert  Airway patency: patent  Nausea & Vomiting: no nausea and no vomiting  Cardiovascular status: hemodynamically stable  Respiratory status: acceptable  Hydration status: euvolemic  Pain management: adequate      No notable events documented.  "

## 2024-03-09 NOTE — Anesthesia Procedure Notes (Signed)
"  Epidural Block    Patient location during procedure: OB  Start time: 03/09/2024 8:45 AM  End time: 03/09/2024 8:47 AM  Reason for block: labor epidural  Staffing  Performed: anesthesiologist   Anesthesiologist: Chalmers Janalee Rich, MD  Performed by: Chalmers Janalee Rich, MD  Authorized by: Chalmers Janalee Rich, MD    Epidural  Patient position: sitting  Prep: ChloraPrep  Patient monitoring: continuous pulse ox and frequent blood pressure checks  Approach: midline  Location: L3-4  Injection technique: LOR saline  Guidance: paresthesia technique  Provider prep: sterile gloves and mask  Needle  Needle type: Tuohy   Needle gauge: 17 G  Needle length: 3.5 in  Needle insertion depth: 4 cm  Catheter type: side hole  Catheter size: 19 G  Catheter at skin depth: 10 cm  Test dose: negativeCatheter Secured: tegaderm  Assessment  Sensory level: T8  Hemodynamics: stable  Attempts: 1  Outcomes: uncomplicated  Additional Notes  + intentional DPE with 25g pencan spinal needle. + CSF.   Preanesthetic Checklist  Completed: patient identified, IV checked, site marked, risks and benefits discussed, surgical/procedural consents, equipment checked, pre-op evaluation, timeout performed, anesthesia consent given, oxygen available, monitors applied/VS acknowledged, fire risk safety assessment completed and verbalized and blood product R/B/A discussed and consented          "

## 2024-03-09 NOTE — Anesthesia Pre Procedure (Signed)
 "Department of Anesthesiology  Preprocedure Note       Name:  Amanda Perry   Age:  28 y.o.  DOB:  05/02/1996                                          MRN:  [id number]         Date:  03/09/2024      Surgeon: * No surgeons listed *    Procedure: * No procedures listed *    Medications prior to admission:   Prior to Admission medications   Medication Sig Start Date End Date Taking? Authorizing Provider   ibuprofen  (ADVIL ;MOTRIN ) 800 MG tablet Take 1 tablet by mouth every 8 hours 04/12/22   Kem Leita ORN, MD       Current medications:    Current Facility-Administered Medications   Medication Dose Route Frequency Provider Last Rate Last Admin    terbutaline  (BRETHINE ) injection 0.25 mg  0.25 mg SubCUTAneous Once PRN Coker, Joni Marie, DO        sodium chloride  flush 0.9 % injection 5-40 mL  5-40 mL IntraVENous 2 times per day Coker, Joni Marie, DO        sodium chloride  flush 0.9 % injection 5-40 mL  5-40 mL IntraVENous PRN Coker, Joni Marie, DO        0.9 % sodium chloride  infusion  25 mL IntraVENous PRN Coker, Joni Marie, DO        loperamide  (IMODIUM ) capsule 2 mg  2 mg Oral PRN Coker, Joni Marie, DO        acetaminophen  (TYLENOL ) tablet 650 mg  650 mg Oral Q4H PRN Coker, Joni Marie, DO        lidocaine  1 % injection 30 mL  30 mL Other PRN Coker, Joni Marie, DO        butorphanol  (STADOL ) injection 1 mg  1 mg IntraVENous Q3H PRN Coker, Joni Marie, DO        diphenhydrAMINE  (BENADRYL ) capsule 25 mg  25 mg Oral Q4H PRN Coker, Joni Marie, DO        Or    diphenhydrAMINE  (BENADRYL ) injection 25 mg  25 mg IntraVENous Q4H PRN Coker, Joni Marie, DO        penicillin  G potassium 5 Million Units in sodium chloride  0.9 % 100 mL IVPB (addEASE)  5 Million Units IntraVENous Once Coker, Joni Marie, DO        Followed by    penicillin  G potassium 2.5 million units in 0.9% sodium chloride  100 mL IVPB  2.5 Million Units IntraVENous Q4H Coker, Joni Marie, DO           Allergies:  No Known Allergies    Problem List:    Patient  Active Problem List   Diagnosis Code    Low back pain M54.50    Asthma J45.909    Abscess L02.91    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    Normal labor O80, Z37.9    Abdominal pain affecting pregnancy O26.899, R10.9    [redacted] weeks gestation of pregnancy Z3A.40       Past Medical History:        Diagnosis Date    Abnormal Pap smear of cervix     Anemia     Asthma     pt denies on 02/16/21-no inhalers    Cervical dysplasia, mild 01/07/2019  ASCUS + HRHPV    Hydradenitis        Past Surgical History:        Procedure Laterality Date    LEEP N/A 02/19/2021    LEEP performed by [place] SHAUNNA August, MD at [facility] MAIN OR    OTHER SURGICAL HISTORY Right     Cyst Removed from Right Leg    WISDOM TOOTH EXTRACTION         Social History:    Social History     Tobacco Use    Smoking status: Never    Smokeless tobacco: Former   Substance Use Topics    Alcohol use: Yes     Alcohol/week: 0.0 - 0.8 standard drinks of alcohol                                Counseling given: Not Answered      Vital Signs (Current): There were no vitals filed for this visit.                                           BP Readings from Last 3 Encounters:   04/12/22 108/77   06/14/21 102/64   03/11/21 118/70       NPO Status:                                                                                 BMI:   Wt Readings from Last 3 Encounters:   04/10/22 85.3 kg (188 lb)   06/14/21 67.1 kg (148 lb)   03/11/21 65.3 kg (144 lb)     There is no height or weight on file to calculate BMI.    CBC:   Lab Results   Component Value Date/Time    WBC 9.5 03/09/2024 07:53 AM    RBC 4.44 03/09/2024 07:53 AM    HGB 13.1 03/09/2024 07:53 AM    HCT 38.8 03/09/2024 07:53 AM    MCV 87.4 03/09/2024 07:53 AM    RDW 12.5 03/09/2024 07:53 AM    PLT 267 03/09/2024 07:53 AM       CMP: No results found for: NA, K, CL, CO2, BUN, CREATININE, GFRAA, AGRATIO, LABGLOM, GLUCOSE, GLU, CALCIUM, BILITOT, ALKPHOS, AST, ALT    POC  Tests: No results for input(s): POCGLU, POCNA, POCK, POCCL, POCBUN, POCHEMO, POCHCT in the last 72 hours.    Coags: No results found for: PROTIME, INR, APTT    HCG (If Applicable):   Lab Results   Component Value Date    PREGTESTUR Negative 02/19/2021        ABGs: No results found for: PHART, PO2ART, PCO2ART, HCO3ART, BEART, O2SATART     Type & Screen (If Applicable):  Lab Results   Component Value Date    ABORH O POSITIVE 04/10/2022    LABANTI NEG 04/10/2022       Drug/Infectious Status (If Applicable):  Lab Results   Component Value Date/Time    HEPCAB <0.1 06/14/2019 08:09 AM       COVID-19  Screening (If Applicable): No results found for: COVID19        Anesthesia Evaluation    Patient summary reviewed and Nursing notes reviewed      Airway:  Mallampati: II            Dental:  normal exam         Pulmonary:   normal exam           ROS comment: Patient denies asthma   Cardiovascular:   Negative CV ROS                  Neuro/Psych:    Negative Neuro/Psych ROS             GI/Hepatic/Renal:  Neg GI/Hepatic/Renal ROS          Endo/Other:  Negative Endo/Other ROS                    Abdominal:              Vascular:  negative vascular ROS.       Other Findings:           Lab Results   Component Value Date    HGB 13.1 03/09/2024    PLT 267 03/09/2024                 Anesthesia Plan      epidural     ASA 2             Anesthetic plan and risks discussed with patient and spouse.      Plan discussed with CRNA.                  River Ambrosio Nuri Catalea Labrecque, MD   03/09/2024            "

## 2024-03-10 MED ORDER — OXYCODONE HCL 5 MG PO TABS
5 | ORAL_TABLET | ORAL | 0 refills | 5.00000 days | Status: AC | PRN
Start: 2024-03-10 — End: 2024-03-13

## 2024-03-10 MED ORDER — IBUPROFEN 800 MG PO TABS
800 | ORAL_TABLET | Freq: Four times a day (QID) | ORAL | 0 refills | 20.00000 days | Status: AC
Start: 2024-03-10 — End: ?

## 2024-03-10 MED FILL — DOCUSATE SODIUM 100 MG PO CAPS: 100 mg | ORAL | Qty: 1 | Fill #0

## 2024-03-10 MED FILL — IBUPROFEN 800 MG PO TABS: 800 mg | ORAL | Qty: 1 | Fill #0

## 2024-03-10 MED FILL — DERMOPLAST 20-0.5 % EX AERO: 20-0.5 % | CUTANEOUS | Qty: 78 | Fill #0

## 2024-03-10 MED FILL — WITCH HAZEL-GLYCERIN EX PADS: CUTANEOUS | Qty: 40 | Fill #0

## 2024-03-10 MED FILL — ACETAMINOPHEN EXTRA STRENGTH 500 MG PO TABS: 500 mg | ORAL | Qty: 2 | Fill #0

## 2024-03-10 NOTE — Discharge Instructions (Signed)
 "     After Your Delivery (the Postpartum Period): Care Instructions  Overview     After childbirth (postpartum period), your body goes through many changes as you recover. In these weeks after delivery, try to take good care of yourself. Get rest whenever you can and accept help from others.  It may take 4 to 6 weeks to feel like yourself again, and possibly longer if you had a cesarean birth. You may feel sore or very tired as you recover. After delivery, you may continue to have contractions as the uterus returns to the size it was before your pregnancy. You will also have some vaginal bleeding. And you may have pain around the vagina as you heal. Several days after delivery you may also have pain and swelling in your breasts as they fill with milk. There are things you can do at home to help ease these discomforts.  After childbirth, it's common to feel emotional. You may feel irritable, cry easily, and feel happy one minute and sad the next. This is called the baby blues. Hormone changes are one cause of these emotional changes. These feelings usually get better within a couple of weeks. If they don't, talk to your doctor or midwife.  In the first couple of weeks after you give birth, your doctor or midwife may want to check in with you and make a plan for follow-up care. You will likely have a complete postpartum visit in the first 3 months after delivery. At that time, your doctor or midwife will check on your recovery and see how you're doing. But if you have questions or concerns before then, you can always call your doctor or midwife.  Follow-up care is a key part of your treatment and safety. Be sure to make and go to all appointments, and call your doctor if you are having problems. It's also a good idea to know your test results and keep a list of the medicines you take.  How can you care for yourself at home?  Taking care of your body  Use pads instead of tampons for bleeding. After birth, you will  have bloody vaginal discharge. You may also pass some blood clots that shouldn't be bigger than an egg. Over the next 6 weeks or so, your bleeding should decrease a little every day and slowly change to a pinkish and then whitish discharge.  For cramps or mild pain, try an over-the-counter pain medicine, such as acetaminophen  (Tylenol ) or ibuprofen  (Advil , Motrin ). Read and follow all instructions on the label.  To ease pain around the vagina or from hemorrhoids:  Put ice or a cold pack on the area for 10 to 20 minutes at a time. Put a thin cloth between the ice and your skin.  Try sitting in a few inches of warm water (sitz bath) when you can or after bowel movements.  Clean yourself with a gentle squeeze of warm water from a bottle instead of wiping with toilet paper.  Use witch hazel or hemorrhoid pads (such as Tucks).  Try using a cold compress for sore and swollen breasts. And wear a supportive bra that fits.  Ease constipation by drinking plenty of fluids and eating high-fiber foods. Ask your doctor or midwife about over-the-counter stool softeners.  Activity  Rest when you can.  Ask for help from family or friends when you need it.  If you can, have another adult in your home for at least 2 or 3 days after  birth.  When you feel ready, try to get some exercise every day. For many people, walking is a good choice. Don't do any heavy exercise until your doctor or midwife says it's okay.  Ask your doctor or midwife when it is okay to have vaginal sex.  If you don't want to get pregnant, talk to your doctor or midwife about birth control options. You can get pregnant even before your period returns. Also, you can get pregnant while you are breastfeeding.  Talk to your doctor or midwife if you want to get pregnant again. They can talk to you about when it is safe.  Emotional health  It's normal to have some sadness, anxiety, and mood swings after delivery. It may help to talk with a trusted friend or family member.  You can also call the Maternal Mental Health Hotline at 1-833-TLC-MAMA ((617) 116-8855) for support. If these mood changes last more than a couple of weeks, talk to your doctor or midwife.  When should you call for help?  Share this information with your partner, family, or a friend. They can help you watch for warning signs.  Call 911  anytime you think you may need emergency care. For example, call if:    You feel you cannot stop from hurting yourself, your baby, or someone else.     You passed out (lost consciousness).     You have chest pain, are short of breath, or cough up blood.     You have a seizure.   Where to get help 24 hours a day, 7 days a week   If you or someone you know talks about suicide, self-harm, a mental health crisis, a substance use crisis, or any other kind of emotional distress, get help right away. You can:    Call the Suicide and Crisis Lifeline at 62.     Call 1-800-273-TALK (352-610-4770).     Text HOME to 5181181732 to access the Crisis Text Line.   Consider saving these numbers in your phone.  Go to 988lifeline.org for more information or to chat online.  Call your doctor or midwife now or seek immediate medical care if:    You have signs of hemorrhage (too much bleeding), such as:  Heavy vaginal bleeding. This means that you are soaking through one or more pads in an hour. Or you pass blood clots bigger than an egg.  Feeling dizzy or lightheaded, or you feel like you may faint.  Feeling so tired or weak that you cannot do your usual activities.  A fast or irregular heartbeat.  New or worse belly pain.     You have signs of infection, such as:  A fever.  Increased pain, swelling, warmth, or redness from an incision or wound.  Frequent or painful urination or blood in your urine.  Vaginal discharge that smells bad.  New or worse belly pain.     You have symptoms of a blood clot in your leg (called a deep vein thrombosis), such as:  Pain in the calf, back of the knee, thigh, or  groin.  Swelling in the leg or groin.  A color change on the leg or groin. The skin may be reddish or purplish, depending on your usual skin color.     You have signs of preeclampsia, such as:  Sudden swelling of your face, hands, or feet.  New vision problems (such as dimness, blurring, or seeing spots).  A severe headache.     You have  signs of heart failure, such as:  New or increased shortness of breath.  New or worse swelling in your legs, ankles, or feet.  Sudden weight gain, such as more than 2 to 3 pounds in a day or 5 pounds in a week.  Feeling so tired or weak that you cannot do your usual activities.     You had spinal or epidural pain relief and have:  New or worse back pain.  Increased pain, swelling, warmth, or redness at the injection site.  Tingling, weakness, or numbness in your legs or groin.   Watch closely for changes in your health, and be sure to contact your doctor or midwife if:    Your vaginal bleeding isn't decreasing.     You feel sad, anxious, or hopeless for more than a few days.     You are having problems with your breasts or breastfeeding.   Where can you learn more?  Go to [employer] and enter A461 to learn more about After Your Delivery (the Postpartum Period): Care Instructions.  Current as of: December 12, 2023  Content Version: 14.6   2024-2025 [place], [place].   Care instructions adapted under license by [facility]. If you have questions about a medical condition or this instruction, always ask your healthcare professional. Romayne Alderman, [facility], disclaims any warranty or liability for your use of this information.    "

## 2024-03-10 NOTE — Progress Notes (Signed)
"  PPD#1 NSVD, no c/o  AFVSS  Abd-soft NTND  Ext-no CT  PLan: Rtn pp care    "

## 2024-03-10 NOTE — Lactation Note (Signed)
"  This note was copied from a baby's chart.  Noted planning for discharge today.  Experienced mom reports baby breastfeeding going ok.  Many questions answered.  Encouraged frequent feeding.  Watch output.  Reviewed breastfeeding packet.  Offered to visualize latch prior to discharge.  Mom declined, will call out if assistance desired.     Measured nipple diameter for flange fit at mom's request.  17mm L and 17mm R.  Discussed standard flanges may be ok, but new information states closer flange fit can be more comfortable and effective.  Mom can try flanges that came with pump and adjust as indicated.  Nipple diameter can fluctuate throughout breastfeeding duration.  Suggest trying 17-19 mm flanges to start if standard 24mm do not work.    Mom and baby are going home today.  Continue to offer the breast without restriction.  Mom's milk should be fully in over the next few days.  Reviewed engorgement precautions.  Hand Expression has been demoed and written hand-out reviewed.  As milk comes in baby will be more alert at the breast and swallows will be more obvious.  Breasts may feel softer once baby has finished nursing.  Baby should be back to birth weight by 35 weeks of age.  And then gain on average 1 oz per day for the next 2-3 months.  Reviewed babies should be exclusively breastfeeding for the first 6 months and that breastfeeding should continue after introduction of appropriate complimentary foods after 6 months.  Initial output should be at least 1 wet and 1 bowel movement for each day old baby is.  By day 5-7 once milk is fully in baby will consistently have 6 or more soaking wet diapers and about 4 bowel movement.  Some babies have a bowel movement with every feeding and some have 1-3 large bowel movements each day.  Inadequate output may indicate inadequate feedings and should be reported to your Pediatrician.  Bowel habits may change as baby gets older.  Encouraged follow-up at Pediatrician in 1-2 days, no  later than 1 week of age.  Call [facility] L[facility]ation Center for any questions as needed or to set up an OP visit.  OP phone calls are returned within 24 hours. Community Breastfeeding Resource List given.    "

## 2024-03-10 NOTE — Progress Notes (Signed)
"  Patient escorted by [facility] staff to private vehicle. Stable at discharge.     "

## 2024-03-10 NOTE — Plan of Care (Signed)
"    Problem: Vaginal Birth or Cesarean Section  Goal: Fetal and maternal status remain reassuring during the birth process  Description:  Birth OB-Pregnancy care plan goal which identifies if the fetal and maternal status remain reassuring during the birth process  Outcome: Progressing     Problem: Postpartum  Goal: Experiences normal postpartum course  Description:  Postpartum OB-Pregnancy care plan goal which identifies if the mother is experiencing a normal postpartum course  Outcome: Progressing     Problem: Pain  Goal: Verbalizes/displays adequate comfort level or baseline comfort level  Outcome: Progressing     Problem: Infection - Adult  Goal: Absence of infection at discharge  Outcome: Progressing     Problem: Safety - Adult  Goal: Free from fall injury  Outcome: Progressing     Problem: Discharge Planning  Goal: Discharge to home or other facility with appropriate resources  Outcome: Progressing     "

## 2024-03-10 NOTE — Discharge Summary (Signed)
"  Obstetrical Discharge Summary     Name: Amanda Perry  MRN: [id number]   SSN: @SSNMASKED @     Date of Birth: Mar 30, 1996   Age: 28 y.o.  Sex: female       Allergies: No Known Allergies     Admit Date:  03/09/2024     Discharge Date:       Admitting Physician: @ADMPHYS3 @     Attending Physician:  Kem Leita ORN, MD     * Admission Diagnoses:  [redacted] weeks gestation of pregnancy [Z3A.40]     * Discharge Diagnoses:     Maite, [place] [[id number]]      Newborn Information       Changing the newborn's delivery date/time could affect patient care.:      Delivery date/time:  03/09/24 11:09   Delivery type: Vaginal, Spontaneous   C-Section Details:                       * Procedures: No admission procedures for hospital encounter.     * Discharge Condition: Good    [facility] Course: Normal hospital course following the delivery.    * Disposition: Home    Discharge Medications:      Medication List        START taking these medications      oxyCODONE  5 MG immediate release tablet  Commonly known as: ROXICODONE   Take 1 tablet by mouth every 4 hours as needed for Pain for up to 3 days. Max Daily Amount: 30 mg            CHANGE how you take these medications      ibuprofen  800 MG tablet  Commonly known as: ADVIL ;MOTRIN   Take 1 tablet by mouth every 6 hours  What changed: when to take this               Where to Get Your Medications        These medications were sent to [id number] [place], [facility] - [address] - P [phone] - F [phone]  [address] [place] [address]      Phone: [phone]   ibuprofen  800 MG tablet  oxyCODONE  5 MG immediate release tablet          * Follow-up Care/Patient Instructions:  Activity: activity as tolerated  Diet: regular diet  Wound Care: keep wound clean and dry    Follow-up Information 6w                       "

## 2024-03-11 ENCOUNTER — Encounter: Payer: BLUE CROSS/BLUE SHIELD | Primary: Family Medicine

## 2024-03-13 ENCOUNTER — Inpatient Hospital Stay: Payer: BLUE CROSS/BLUE SHIELD | Attending: Obstetrics & Gynecology

## 2024-03-13 NOTE — Anesthesia Procedure Notes (Signed)
"  Epidural Blood Patch    Patient location during procedure: OB  Start time: 03/13/2024 2:41 PM  End time: 03/13/2024 2:52 PM  Staffing  Performed: anesthesiologist   Anesthesiologist: Max Sieving, MD  Performed by: Max Sieving, MD  Authorized by: Max Sieving, MD    Epidural  Patient position: sitting  Prep: ChloraPrep  Patient monitoring: continuous pulse ox and frequent blood pressure checks  Approach: midline  Injection technique: LOR saline  Provider prep: mask and sterile gloves  Needle  Needle type: Tuohy   Needle gauge: 17 G  Needle length: 6 in  Needle insertion depth: 4 cm  Assessment  Attempts: 1  Additional Notes  Placed without difficulty 1 level below epidural placement site. Injected 23cc sterile blood. Patient tolerated well. Supine for 1 hour and will reassess.   Preanesthetic Checklist  Completed: patient identified, IV checked, site marked, risks and benefits discussed, surgical/procedural consents, equipment checked, pre-op evaluation, timeout performed, anesthesia consent given, oxygen available, monitors applied/VS acknowledged, fire risk safety assessment completed and verbalized and blood product R/B/A discussed and consented          "

## 2024-03-13 NOTE — Anesthesia Pre Procedure (Signed)
 "Department of Anesthesiology  Preprocedure Note       Name:  Leathia Farnell   Age:  28 y.o.  DOB:  1995-07-08                                          MRN:  [id number]         Date:  03/13/2024      Surgeon: * No surgeons listed *    Procedure: * No procedures listed *    Medications prior to admission:   Prior to Admission medications   Medication Sig Start Date End Date Taking? Authorizing Provider   ibuprofen  (ADVIL ;MOTRIN ) 800 MG tablet Take 1 tablet by mouth every 6 hours 03/10/24  Yes Lebel, Leita ORN, MD   oxyCODONE  (ROXICODONE ) 5 MG immediate release tablet Take 1 tablet by mouth every 4 hours as needed for Pain for up to 3 days. Max Daily Amount: 30 mg 03/10/24 03/13/24 Yes Lebel, Laura W, MD       Current medications:    No current facility-administered medications for this encounter.       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    ASCUS + HRHPV    Hydradenitis        Past Surgical History:        Procedure Laterality Date    LEEP N/A 02/19/2021    LEEP performed by [place] SHAUNNA August, MD at [facility] MAIN OR    OTHER SURGICAL HISTORY Right     Cyst Removed from Right Leg    WISDOM TOOTH EXTRACTION         Social History:    Social History     Tobacco Use    Smoking status: Never    Smokeless tobacco: Former   Substance Use Topics    Alcohol use: Yes     Alcohol/week: 0.0 - 0.8 standard drinks of alcohol                                Counseling given: Not Answered      Vital Signs (Current):   Vitals:    03/13/24 1203 03/13/24 1459  03/13/24 1514   BP: 127/77 124/68 122/68   Pulse: 75 80 76   Resp: 16     Temp: 98 F (36.7 C)     TempSrc: Oral     SpO2: 100%  BP Readings from Last 3 Encounters:   03/13/24 122/68   03/10/24 114/77   04/12/22 108/77       NPO Status:                                                                                 BMI:   Wt Readings from Last 3 Encounters:   04/10/22 85.3 kg (188 lb)   06/14/21 67.1 kg (148 lb)   03/11/21 65.3 kg (144 lb)     There is no height or weight on file to calculate BMI.    CBC:   Lab Results   Component Value Date/Time    WBC 9.5 03/09/2024 07:53 AM    RBC 4.44 03/09/2024 07:53 AM    HGB 13.1 03/09/2024 07:53 AM    HCT 38.8 03/09/2024 07:53 AM    MCV 87.4 03/09/2024 07:53 AM    RDW 12.5 03/09/2024 07:53 AM    PLT 267 03/09/2024 07:53 AM       CMP: No results found for: NA, K, CL, CO2, BUN, CREATININE, GFRAA, AGRATIO, LABGLOM, GLUCOSE, GLU, CALCIUM, BILITOT, ALKPHOS, AST, ALT    POC Tests: No results for input(s): POCGLU, POCNA, POCK, POCCL, POCBUN, POCHEMO, POCHCT in the last 72 hours.    Coags: No results found for: PROTIME, INR, APTT    HCG (If Applicable):   Lab Results   Component Value Date    PREGTESTUR Negative 02/19/2021        ABGs: No results found for: PHART, PO2ART, PCO2ART, HCO3ART, BEART, O2SATART     Type & Screen (If Applicable):  Lab Results   Component Value Date    ABORH O POSITIVE 03/09/2024    LABANTI NEG 03/09/2024       Drug/Infectious Status (If Applicable):  Lab Results   Component Value Date/Time    HEPCAB <0.1 06/14/2019 08:09 AM       COVID-19 Screening (If Applicable): No results found for: COVID19        Anesthesia Evaluation    Patient summary reviewed and Nursing notes reviewed      Airway:  Mallampati: II  TM distance: >3 FB   Neck ROM: full    Mouth opening: > = 3 FB   Dental:  normal exam         Pulmonary:   normal exam           ROS comment:  Patient denies asthma   Cardiovascular:   Negative CV ROS                  Neuro/Psych:    Negative Neuro/Psych ROS             GI/Hepatic/Renal:  Neg GI/Hepatic/Renal ROS          Endo/Other:  Negative Endo/Other ROS                    Abdominal:              Vascular:  negative vascular ROS.       Other Findings:           Lab Results   Component Value Date  HGB 13.1 03/09/2024    PLT 267 03/09/2024                 Anesthesia Plan      epidural     ASA 2     (Epidural blood patch.     Patient epidural placed 10/11. Positional headache started ~24 hours later. Has progressed in intensity over past 72 hours. Reports upright headache 9 or 10/10 and when supine 1 to 2/10.   Discussed normal course of PDPH and different treatment options. Rest/fluids/caffeine vs EBP. Patient discussed with husband and they decided would like to proceed with EBP today. Risks/benefits discussed. )        Anesthetic plan and risks discussed with patient and spouse.      Plan discussed with CRNA.                    TORIBIO CRUISE, MD   03/13/2024            "

## 2024-03-13 NOTE — Anesthesia Postprocedure Evaluation (Signed)
"  Department of Anesthesiology  Postprocedure Note    Patient: Amanda Perry  MRN: [id number]  Birthdate: [birth year]  Date of evaluation: 03/13/2024    Procedure Summary       Date: 03/13/24 Room / Location:     Anesthesia Start:  Anesthesia Stop:     Procedure: Epidural Blood Patch Diagnosis:     Scheduled Providers:  Responsible Provider:     Anesthesia Type: epidural ASA Status: 2            Anesthesia Type: No value filed.    Aldrete Phase I:      Aldrete Phase II:      Anesthesia Post Evaluation    Patient location during evaluation: bedside  Patient participation: complete - patient participated  Level of consciousness: awake and alert  Airway patency: patent  Nausea & Vomiting: no vomiting  Cardiovascular status: hemodynamically stable  Respiratory status: acceptable  Hydration status: euvolemic  Comments: Sat patient up to 45 and then 60 degrees. Reports headache has not returned near the same degree as prior to EBP. Discussed bed rest for next 24 hours as much as possible. Try to avoid bending/lifting/straining. Questions/concerns addressed with patient and husband at bedside. Ok for discharge.    Pain management: adequate      No notable events documented.  "

## 2024-03-13 NOTE — H&P (Signed)
 "Subjective:     Reason for Triage visit:  Postpartum, possible spinal headache    History of Present Illness: Amanda Perry is post partum day 4 from a vaginal delivery.  She presented in spontaneous labor and had an uncomplicated vaginal delivery.  She was discharged home on postpartum day 1.  She has no history of hypertension.    Presents today with complaint of a headache.  She states that has been there ever since delivery, though it has been worsening over the last couple of days.  She has tried alternating Tylenol  and ibuprofen  without much help.  Tried caffeine and does not think it helped either, but it was only 1 cup of coffee.  She was able to sleep a little bit but the headache did not go away.    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    ASCUS + HRHPV    Hydradenitis      Past Surgical History:   Procedure Laterality Date    LEEP N/A 02/19/2021    LEEP performed by [place] SHAUNNA August, MD at [facility] MAIN OR    OTHER SURGICAL HISTORY Right     Cyst Removed from Right Leg    WISDOM TOOTH EXTRACTION       Social History     Occupational History    Not on file   Tobacco Use    Smoking status: Never    Smokeless tobacco: Former   [employer]    Vaping status: Former    Substances: Nicotine   Substance and Sexual Activity    Alcohol use: Yes     Alcohol/week: 0.0 - 0.8 standard drinks of alcohol    Drug use: No    Sexual activity: Yes     Partners: Male     Birth control/protection: Pill     Comment: ocp      Family History   Problem Relation Age of Onset    Cancer Maternal Grandmother      Diabetes Maternal Grandmother     Brain Cancer Sister 59    Diabetes Maternal Aunt     Diabetes Paternal Uncle     Breast Cancer Neg Hx     Colon Cancer Neg Hx     Ovarian Cancer Neg Hx        No Known Allergies  Prior to Admission medications   Medication Sig Start Date End Date Taking? Authorizing Provider   ibuprofen  (ADVIL ;MOTRIN ) 800 MG tablet Take 1 tablet by mouth every 6 hours 03/10/24  Yes Lebel, Leita ORN, MD   oxyCODONE  (ROXICODONE ) 5 MG immediate release tablet Take 1 tablet by mouth every 4 hours as needed for Pain for up to 3 days. Max Daily Amount: 30 mg 03/10/24 03/13/24 Yes Kem Leita ORN, MD  Objective:     Vitals:    VITALS:  BP 127/77   Pulse 75   Temp 98 F (36.7 C) (Oral)   Resp 16   SpO2 100%      Physical Exam:  CONSTITUTIONAL:  awake, cooperative, no acute distress, though appears fatigued and has the lights dimmed  She is sitting in the partially elevated stretcher.  When I laid the stretcher flat and asked her to reevaluate her headache, she states that it does feel better that way.      Assessment:   4 days postpartum from a vaginal delivery  Probable spinal headache    Plan:  Discussed with anesthesiologist, Dr. Max, who will come and evaluate for possible blood patch        "

## 2024-03-13 NOTE — Discharge Instructions (Signed)
 Blood Patch Procedure: Care Instructions  Overview     A blood patch procedure uses your own blood to help your headache.  Headaches may happen after certain procedures that involve the spine, such as a myelogram or an epidural for anesthesia. In these procedures, the needle that is used sometimes causes a bit of spinal fluid to leak out of the space around your spinal cord. The leak usually isn't dangerous. But if enough fluid leaks out, it changes the pressure around your spinal cord. The pressure around your spinal cord can also change if fluid is removed for testing during a spinal tap (lumbar puncture). This change in pressure can cause a very bad headache.  To apply a blood patch, first your doctor takes blood from your arm. Then the blood is injected into the area of your lower back where the leak happened. The blood restores the pressure around your spinal cord. It also helps seal any leak that may still be there.  Many people feel better right away, but it could take a day or two. And a few people need to have a second blood patch.  Follow-up care is a key part of your treatment and safety. Be sure to make and go to all appointments, and call your doctor if you are having problems. It's also a good idea to know your test results and keep a list of the medicines you take.  How can you care for yourself at home?  For the first 24 hours, return to your regular activities slowly. Avoid strenuous activity.  If the injection site is sore, put ice or a cold pack on the area for 10 to 20 minutes at a time. Put a thin cloth between the ice and your skin.  When should you call for help?   Call your doctor now or seek immediate medical care if:    You have a fever.     You have a new or worse headache.     You have a stiff neck.     You have drainage or bleeding from the puncture site.     You have tingling, weakness, or numbness in your legs.     You have trouble urinating or can pass only very small amounts of  urine.   Watch closely for changes in your health, and be sure to contact your doctor if:    You do not get better as expected.   Current as of: December 28, 2022  Content Version: 14.6   2024-2025 [place], .   Care instructions adapted under license by [facility]. If you have questions about a medical condition or this instruction, always ask your healthcare professional. Romayne Alderman, [facility], disclaims any warranty or liability for your use of this information.

## 2024-03-13 NOTE — Progress Notes (Signed)
"  Iv discontinued intact.   Discharge precautions reviewed.   Pt wheeled to private vehicle with significant other.  "
